# Patient Record
Sex: Female | Born: 1950
Health system: Southern US, Community
[De-identification: ages and names within clinical notes are randomized; demographics above are authoritative.]

## PROBLEM LIST (undated history)

## (undated) DIAGNOSIS — S0300XA Dislocation of jaw, unspecified side, initial encounter: Secondary | ICD-10-CM

## (undated) DIAGNOSIS — Z9889 Other specified postprocedural states: Secondary | ICD-10-CM

## (undated) DIAGNOSIS — R079 Chest pain, unspecified: Secondary | ICD-10-CM

## (undated) DIAGNOSIS — I6523 Occlusion and stenosis of bilateral carotid arteries: Secondary | ICD-10-CM

## (undated) DIAGNOSIS — F329 Major depressive disorder, single episode, unspecified: Secondary | ICD-10-CM

## (undated) DIAGNOSIS — I10 Essential (primary) hypertension: Secondary | ICD-10-CM

## (undated) DIAGNOSIS — H6992 Unspecified Eustachian tube disorder, left ear: Secondary | ICD-10-CM

## (undated) DIAGNOSIS — E559 Vitamin D deficiency, unspecified: Secondary | ICD-10-CM

## (undated) DIAGNOSIS — Z91018 Allergy to other foods: Secondary | ICD-10-CM

## (undated) DIAGNOSIS — K118 Other diseases of salivary glands: Secondary | ICD-10-CM

## (undated) DIAGNOSIS — R519 Headache, unspecified: Secondary | ICD-10-CM

## (undated) DIAGNOSIS — F32A Depression, unspecified: Secondary | ICD-10-CM

## (undated) DIAGNOSIS — E785 Hyperlipidemia, unspecified: Secondary | ICD-10-CM

## (undated) DIAGNOSIS — H9312 Tinnitus, left ear: Secondary | ICD-10-CM

## (undated) DIAGNOSIS — S32000A Wedge compression fracture of unspecified lumbar vertebra, initial encounter for closed fracture: Secondary | ICD-10-CM

## (undated) DIAGNOSIS — R51 Headache: Secondary | ICD-10-CM

## (undated) DIAGNOSIS — R7989 Other specified abnormal findings of blood chemistry: Secondary | ICD-10-CM

## (undated) DIAGNOSIS — S32019A Unspecified fracture of first lumbar vertebra, initial encounter for closed fracture: Secondary | ICD-10-CM

## (undated) DIAGNOSIS — R7301 Impaired fasting glucose: Secondary | ICD-10-CM

## (undated) DIAGNOSIS — F419 Anxiety disorder, unspecified: Secondary | ICD-10-CM

## (undated) DIAGNOSIS — R0600 Dyspnea, unspecified: Secondary | ICD-10-CM

## (undated) HISTORY — DX: Chest pain, unspecified: R07.9

## (undated) HISTORY — DX: Dyspnea, unspecified: R06.00

## (undated) HISTORY — DX: Impaired fasting glucose: R73.01

## (undated) HISTORY — DX: Vitamin D deficiency, unspecified: E55.9

## (undated) HISTORY — DX: Wedge compression fracture of unspecified lumbar vertebra, initial encounter for closed fracture: S32.000A

## (undated) HISTORY — DX: Other diseases of salivary glands: K11.8

## (undated) HISTORY — DX: Other specified abnormal findings of blood chemistry: R79.89

## (undated) HISTORY — PX: TONSILLECTOMY: SUR1361

## (undated) HISTORY — DX: Essential (primary) hypertension: I10

## (undated) HISTORY — DX: Other specified postprocedural states: Z98.890

## (undated) HISTORY — DX: Allergy to other foods: Z91.018

## (undated) HISTORY — DX: Depression, unspecified: F32.A

## (undated) HISTORY — DX: Unspecified fracture of first lumbar vertebra, initial encounter for closed fracture: S32.019A

## (undated) HISTORY — DX: Unspecified Eustachian tube disorder, left ear: H69.92

## (undated) HISTORY — DX: Occlusion and stenosis of bilateral carotid arteries: I65.23

## (undated) HISTORY — DX: Major depressive disorder, single episode, unspecified: F32.9

## (undated) HISTORY — DX: Tinnitus, left ear: H93.12

## (undated) HISTORY — PX: NOSE SURGERY: SHX723

---

## 1999-04-15 ENCOUNTER — Other Ambulatory Visit: Admission: RE | Admit: 1999-04-15 | Discharge: 1999-04-15 | Payer: Self-pay | Admitting: Gynecology

## 2000-12-05 ENCOUNTER — Inpatient Hospital Stay (HOSPITAL_COMMUNITY): Admission: AD | Admit: 2000-12-05 | Discharge: 2000-12-05 | Payer: Self-pay | Admitting: *Deleted

## 2000-12-25 ENCOUNTER — Other Ambulatory Visit: Admission: RE | Admit: 2000-12-25 | Discharge: 2000-12-25 | Payer: Self-pay | Admitting: Gynecology

## 2001-12-31 ENCOUNTER — Other Ambulatory Visit: Admission: RE | Admit: 2001-12-31 | Discharge: 2001-12-31 | Payer: Self-pay | Admitting: Gynecology

## 2003-04-03 ENCOUNTER — Other Ambulatory Visit: Admission: RE | Admit: 2003-04-03 | Discharge: 2003-04-03 | Payer: Self-pay | Admitting: Gynecology

## 2003-06-28 ENCOUNTER — Ambulatory Visit (HOSPITAL_COMMUNITY): Admission: RE | Admit: 2003-06-28 | Discharge: 2003-06-28 | Payer: Self-pay | Admitting: Gastroenterology

## 2004-08-13 ENCOUNTER — Other Ambulatory Visit: Admission: RE | Admit: 2004-08-13 | Discharge: 2004-08-13 | Payer: Self-pay | Admitting: Gynecology

## 2005-08-14 ENCOUNTER — Other Ambulatory Visit: Admission: RE | Admit: 2005-08-14 | Discharge: 2005-08-14 | Payer: Self-pay | Admitting: Gynecology

## 2006-04-10 ENCOUNTER — Encounter: Admission: RE | Admit: 2006-04-10 | Discharge: 2006-04-10 | Payer: Self-pay | Admitting: Gynecology

## 2006-04-15 ENCOUNTER — Encounter: Admission: RE | Admit: 2006-04-15 | Discharge: 2006-06-09 | Payer: Self-pay | Admitting: Anesthesiology

## 2007-10-28 ENCOUNTER — Other Ambulatory Visit: Admission: RE | Admit: 2007-10-28 | Discharge: 2007-10-28 | Payer: Self-pay | Admitting: Gynecology

## 2007-11-19 ENCOUNTER — Encounter: Admission: RE | Admit: 2007-11-19 | Discharge: 2007-11-19 | Payer: Self-pay | Admitting: Gynecology

## 2008-12-29 HISTORY — PX: FOOT SURGERY: SHX648

## 2009-02-05 ENCOUNTER — Ambulatory Visit: Payer: Self-pay | Admitting: Gynecology

## 2009-03-14 ENCOUNTER — Encounter: Payer: Self-pay | Admitting: Gynecology

## 2009-03-14 ENCOUNTER — Ambulatory Visit: Payer: Self-pay | Admitting: Gynecology

## 2009-03-14 ENCOUNTER — Other Ambulatory Visit: Admission: RE | Admit: 2009-03-14 | Discharge: 2009-03-14 | Payer: Self-pay | Admitting: Gynecology

## 2009-03-26 ENCOUNTER — Encounter: Admission: RE | Admit: 2009-03-26 | Discharge: 2009-03-26 | Payer: Self-pay | Admitting: Gynecology

## 2009-08-07 ENCOUNTER — Emergency Department (HOSPITAL_COMMUNITY): Admission: EM | Admit: 2009-08-07 | Discharge: 2009-08-07 | Payer: Self-pay | Admitting: Emergency Medicine

## 2010-04-10 ENCOUNTER — Encounter: Admission: RE | Admit: 2010-04-10 | Discharge: 2010-04-10 | Payer: Self-pay | Admitting: Gynecology

## 2010-04-17 ENCOUNTER — Other Ambulatory Visit: Admission: RE | Admit: 2010-04-17 | Discharge: 2010-04-17 | Payer: Self-pay | Admitting: Gynecology

## 2010-04-17 ENCOUNTER — Ambulatory Visit: Payer: Self-pay | Admitting: Gynecology

## 2010-05-28 ENCOUNTER — Ambulatory Visit: Payer: Self-pay | Admitting: Gynecology

## 2010-05-28 DIAGNOSIS — M810 Age-related osteoporosis without current pathological fracture: Secondary | ICD-10-CM | POA: Insufficient documentation

## 2011-01-02 ENCOUNTER — Ambulatory Visit
Admission: RE | Admit: 2011-01-02 | Discharge: 2011-01-02 | Payer: Self-pay | Source: Home / Self Care | Attending: Gynecology | Admitting: Gynecology

## 2011-04-05 LAB — CBC
HCT: 35.1 % — ABNORMAL LOW (ref 36.0–46.0)
MCHC: 34.5 g/dL (ref 30.0–36.0)
Platelets: 244 10*3/uL (ref 150–400)
RBC: 3.74 MIL/uL — ABNORMAL LOW (ref 3.87–5.11)
RDW: 12.8 % (ref 11.5–15.5)
WBC: 5.3 10*3/uL (ref 4.0–10.5)

## 2011-04-05 LAB — POCT I-STAT, CHEM 8
BUN: 24 mg/dL — ABNORMAL HIGH (ref 6–23)
Calcium, Ion: 1.11 mmol/L — ABNORMAL LOW (ref 1.12–1.32)
Glucose, Bld: 90 mg/dL (ref 70–99)
Hemoglobin: 12.9 g/dL (ref 12.0–15.0)
Potassium: 3.8 mEq/L (ref 3.5–5.1)
Sodium: 133 mEq/L — ABNORMAL LOW (ref 135–145)
TCO2: 23 mmol/L (ref 0–100)

## 2011-04-05 LAB — URINALYSIS, ROUTINE W REFLEX MICROSCOPIC
Bilirubin Urine: NEGATIVE
Hgb urine dipstick: NEGATIVE
Ketones, ur: NEGATIVE mg/dL
Nitrite: NEGATIVE

## 2011-04-05 LAB — DIFFERENTIAL
Eosinophils Absolute: 0 10*3/uL (ref 0.0–0.7)
Lymphocytes Relative: 20 % (ref 12–46)
Lymphs Abs: 1 10*3/uL (ref 0.7–4.0)
Monocytes Relative: 8 % (ref 3–12)

## 2011-04-05 LAB — COMPREHENSIVE METABOLIC PANEL
Alkaline Phosphatase: 82 U/L (ref 39–117)
BUN: 20 mg/dL (ref 6–23)
CO2: 26 mEq/L (ref 19–32)
GFR calc Af Amer: >60 mL/min (ref >60)
GFR calc non Af Amer: 55 mL/min — ABNORMAL LOW (ref >60)
Potassium: 3.8 mEq/L (ref 3.5–5.1)
Sodium: 134 mEq/L — ABNORMAL LOW (ref 135–145)
Total Bilirubin: 0.7 mg/dL (ref 0.3–1.2)
Total Protein: 6.1 g/dL (ref 6.0–8.3)

## 2011-04-05 LAB — HEMOCCULT GUIAC POC 1CARD (OFFICE): Fecal Occult Bld: NEGATIVE

## 2011-04-05 LAB — TYPE AND SCREEN
ABO/RH(D): A POS
Antibody Screen: POSITIVE
DAT, IgG: NEGATIVE

## 2011-04-05 LAB — LACTIC ACID, PLASMA: Lactic Acid, Venous: 0.8 mmol/L (ref 0.5–2.2)

## 2011-04-05 LAB — BRAIN NATRIURETIC PEPTIDE: Pro B Natriuretic peptide (BNP): <30 pg/mL (ref 0.0–100.0)

## 2011-04-05 LAB — D-DIMER, QUANTITATIVE: D-Dimer, Quant: <0.22 ug/mL-FEU (ref 0.00–0.48)

## 2011-04-05 LAB — PROTIME-INR: INR: 1.2 (ref 0.00–1.49)

## 2011-04-05 LAB — GLUCOSE, CAPILLARY

## 2011-04-05 LAB — POCT CARDIAC MARKERS: CKMB, poc: 1.3 ng/mL (ref 1.0–8.0)

## 2011-04-05 LAB — URINE CULTURE

## 2011-04-05 LAB — APTT: aPTT: 24 seconds (ref 24–37)

## 2011-05-16 NOTE — Op Note (Signed)
   NAME:  Gina Fernandez, Gina Fernandez                        ACCOUNT NO.:  [id number]   MEDICAL RECORD NO.:  [id number]                   PATIENT TYPE:  AMB   LOCATION:  ENDO                                 FACILITY:  [facility]   PHYSICIAN:  John C. Madilyn Fireman, M.D.                 DATE OF BIRTH:  1950-12-31   DATE OF PROCEDURE:  06/28/2003  DATE OF DISCHARGE:                                 OPERATIVE REPORT   PROCEDURE:  Flexible sigmoidoscopy.   INDICATION FOR PROCEDURE:  Colon cancer screening in a 60 year old patient  with no prior colon screening.   PROCEDURE:  The patient was placed in the left lateral decubitus position  and placed on the pulse monitor with continuous low-flow oxygen delivered by  nasal cannula.  She was sedated with 125 mcg IV fentanyl and 14 mg IV  Versed.  During the sedation she was continuously physically restless and  very verbal and would not follow instructions and never could be adequately  sedated.  She flinched when the scope was inserted into the rectum and with  any kind of movement of the scope whatsoever.  At about 20-25 cm there was a  tight angulation at the rectosigmoid junction, and she did not tolerate  attempts to traverse it.  At that point I just decided to limit the  procedure with further attempts, and attempted colonoscopy was converted to  a flexible sigmoidoscopy.  The distal sigmoid and rectum appeared normal.  The scope was then withdrawn and the patient returned to the recovery room  in stable condition.  She did not tolerate the procedure well as outlined  above, but there were no immediate complications.   IMPRESSION:  1. Normal sigmoidoscopy to 30 cm.  2. Unable to advance scope further due to inability to sedate the patient.   PLAN:  We will see if she can tolerate barium enema at some point.  If not,  I will take Hemoccults and if positive, may need to do the procedure with  use of Propofol.     John C. Madilyn Fireman, M.D.    J[facility]/MEDQ  D:  06/28/2003  T:  06/28/2003  Job:  [id number]   cc:   Pam Drown, M.D.  [address]  [place]  [place] [address]  Fax: [phone]

## 2011-08-21 ENCOUNTER — Encounter: Payer: Self-pay | Admitting: Gynecology

## 2011-08-21 ENCOUNTER — Other Ambulatory Visit (HOSPITAL_COMMUNITY)
Admission: RE | Admit: 2011-08-21 | Discharge: 2011-08-21 | Disposition: A | Discharge status: Home / Self Care | Payer: BC Managed Care – PPO | Source: Ambulatory Visit | Attending: Gynecology | Admitting: Gynecology

## 2011-08-21 ENCOUNTER — Ambulatory Visit (INDEPENDENT_AMBULATORY_CARE_PROVIDER_SITE_OTHER): Payer: BC Managed Care – PPO | Admitting: Gynecology

## 2011-08-21 VITALS — Ht 62.0 in | Wt 132.0 lb

## 2011-08-21 DIAGNOSIS — Z01419 Encounter for gynecological examination (general) (routine) without abnormal findings: Secondary | ICD-10-CM | POA: Insufficient documentation

## 2011-08-21 DIAGNOSIS — B9689 Other specified bacterial agents as the cause of diseases classified elsewhere: Secondary | ICD-10-CM

## 2011-08-21 DIAGNOSIS — N760 Acute vaginitis: Secondary | ICD-10-CM

## 2011-08-21 DIAGNOSIS — N898 Other specified noninflammatory disorders of vagina: Secondary | ICD-10-CM

## 2011-08-21 DIAGNOSIS — F329 Major depressive disorder, single episode, unspecified: Secondary | ICD-10-CM | POA: Insufficient documentation

## 2011-08-21 DIAGNOSIS — B3731 Acute candidiasis of vulva and vagina: Secondary | ICD-10-CM

## 2011-08-21 DIAGNOSIS — A499 Bacterial infection, unspecified: Secondary | ICD-10-CM

## 2011-08-21 DIAGNOSIS — N951 Menopausal and female climacteric states: Secondary | ICD-10-CM

## 2011-08-21 DIAGNOSIS — B373 Candidiasis of vulva and vagina: Secondary | ICD-10-CM

## 2011-08-21 DIAGNOSIS — I10 Essential (primary) hypertension: Secondary | ICD-10-CM | POA: Insufficient documentation

## 2011-08-21 DIAGNOSIS — F32A Depression, unspecified: Secondary | ICD-10-CM | POA: Insufficient documentation

## 2011-08-21 DIAGNOSIS — R82998 Other abnormal findings in urine: Secondary | ICD-10-CM

## 2011-08-21 DIAGNOSIS — N390 Urinary tract infection, site not specified: Secondary | ICD-10-CM

## 2011-08-21 DIAGNOSIS — R3915 Urgency of urination: Secondary | ICD-10-CM

## 2011-08-21 DIAGNOSIS — M810 Age-related osteoporosis without current pathological fracture: Secondary | ICD-10-CM

## 2011-08-21 MED ORDER — PHENAZOPYRIDINE HCL 200 MG PO TABS: 200.0000 mg | ORAL_TABLET | Freq: Three times a day (TID) | ORAL | Status: DC | PRN

## 2011-08-21 MED ORDER — METRONIDAZOLE 500 MG PO TABS: 500.0000 mg | ORAL_TABLET | Freq: Two times a day (BID) | ORAL | Status: AC

## 2011-08-21 MED ORDER — FLUCONAZOLE 150 MG PO TABS: 150.0000 mg | ORAL_TABLET | Freq: Once | ORAL | Status: AC

## 2011-08-21 MED ORDER — SULFAMETHOXAZOLE-TRIMETHOPRIM 800-160 MG PO TABS: 1.0000 | ORAL_TABLET | Freq: Two times a day (BID) | ORAL | Status: AC

## 2011-08-21 NOTE — Progress Notes (Signed)
Gina Fernandez 03/30/1951 [id number]        60 y.o.  for annual exam.  For the last 2 days she has had worsening dysuria and frequency. She had a Septra and a pyridium from a prior treatment at home she took these last night. No fevers chills or other constitutional symptoms.   She does note some sweating although she is exercising more but said this seems to be in excess of what she normally does. No real night sweats but more hot flushes during the day. She is postmenopausal had been on HRT a number of years ago but none recently.  No hair changes skin changes weight changes or other constitutional symptoms. She also has a history of osteoporosis -3.5 [facility] may 2011 DEXA study. She had been on bisphosphonates starting in 2005 discontinued in 9 months ago herself. She does note some bilateral leg tenderness both in the upper mid femur range as well as in the lower mid leg range. She notes this primarily when she is up and down stairs again she has been exercising more and beginning to jog.  Past medical history,surgical history, allergies, family history and social history were all reviewed and documented in the EPIC chart. ROS:  Was performed and pertinent positives and negatives are included in the history.  Exam: chaperone present There were no vitals filed for this visit. General appearance  Normal Skin grossly normal Head/Neck normal with no cervical or supraclavicular adenopathy thyroid normal Lungs  clear Cardiac RR, without RMG Abdominal  soft, nontender, without masses, organomegaly or hernia Breasts  examined lying and sitting without masses, retractions, discharge or axillary adenopathy. Pelvic  Ext/BUS/vagina  normal white discharge noted KOH wet prep done,atrophic changes noted  Cervix  normal  Pap done  Uterus  Antevert it, normal size, shape and contour, midline and mobile nontender   Adnexa  Without masses or tenderness    Anus and perineum  normal   Rectovaginal  normal  sphincter tone without palpated masses or tenderness.    Assessment/Plan:  60 y.o. female for annual exam.    #1 UTI. Urine analysis is consistent with UTI as is her symptoms. We'll treat with Septra DS 1 by mouth twice a day x3 days and peridium 200 mg 3 times a day x5 days as needed. Followup if symptoms persist or recur #2 White discharge. KOH wet prep is positive for yeast and BV we'll treat with Diflucan 150x1 dose Flagyl 500 mg twice a day x7 days supple avoidance discussed. #3 Sweating. When check baseline TSH. If normal then we'll follow for now. #4 Osteoporosis. Last DEXA May 2011 [place] -3.5 T score. I've recommended she reschedule her DEXA scan now for a short interval followup. She has been on bisphosphonates for 6-7 years she discontinued them herself 9 months ago. I again reviewed the issues of bisphosphonates in risks to include long-term use possible association with atypical fractures. She is complaining of leg pain on both bilateral both the upper and lower extremities she is starting to jog am wondering if it's not more exercise related. I am not sure what the best test for atypical fracture screening and I told her I would get back to her on that after discussion with my colleagues. If bone density is stable on short interval followup options for stopping treatment at this time and then restudied in one to 2 years versus switching to a different medication such as Prolia, Forteo possible Evista reviewed.  We will further  discuss after her DEXA study. I did order a vitamin D level as she is known to be low and vitamin D her last value in March of this year was in the 30 range. #5 Health maintenance. Self breast exams on a monthly basis discussed urged. She is due for mammogram now knows to schedule this. Patient reports having attempted colonoscopy but the gastroenterologist said they did not complete it and I encouraged her to followup with the gastroenterologist to have a colonoscopy. No  other blood work was done today besides her TSH and vitamin D level was all done through her primary who sees her routinely for her medical issues.    Dara Lords MD, 4:57 PM 08/21/2011

## 2011-09-17 ENCOUNTER — Telehealth: Payer: Self-pay | Admitting: *Deleted

## 2011-09-17 NOTE — Telephone Encounter (Signed)
Message copied by Valeda Malm L on Wed Sep 17, 2011  4:50 PM ------      Message from: Colin Broach P      Created: Wed Sep 17, 2011  9:57 AM       Does patient have appointment to see me?  If not she needs one reference to her leg pain.

## 2011-09-19 NOTE — Telephone Encounter (Signed)
Lm for patient to call

## 2011-09-23 NOTE — Telephone Encounter (Signed)
Lm for patient to call the office to make an appointment for leg pain.

## 2011-10-07 NOTE — Telephone Encounter (Signed)
Lm on mobile vm.  Informed information below.

## 2011-10-17 ENCOUNTER — Encounter: Payer: Self-pay | Admitting: *Deleted

## 2012-04-06 ENCOUNTER — Other Ambulatory Visit: Payer: Self-pay | Admitting: *Deleted

## 2012-04-06 DIAGNOSIS — E559 Vitamin D deficiency, unspecified: Secondary | ICD-10-CM

## 2012-04-30 ENCOUNTER — Emergency Department (HOSPITAL_COMMUNITY)
Admission: EM | Admit: 2012-04-30 | Discharge: 2012-04-30 | Disposition: A | Discharge status: Home / Self Care | Payer: BC Managed Care – PPO | Attending: Emergency Medicine | Admitting: Emergency Medicine

## 2012-04-30 ENCOUNTER — Other Ambulatory Visit: Payer: Self-pay

## 2012-04-30 ENCOUNTER — Emergency Department (HOSPITAL_COMMUNITY): Payer: BC Managed Care – PPO

## 2012-04-30 ENCOUNTER — Encounter (HOSPITAL_COMMUNITY): Payer: Self-pay | Admitting: Emergency Medicine

## 2012-04-30 DIAGNOSIS — R61 Generalized hyperhidrosis: Secondary | ICD-10-CM | POA: Insufficient documentation

## 2012-04-30 DIAGNOSIS — R079 Chest pain, unspecified: Secondary | ICD-10-CM | POA: Insufficient documentation

## 2012-04-30 DIAGNOSIS — M810 Age-related osteoporosis without current pathological fracture: Secondary | ICD-10-CM | POA: Insufficient documentation

## 2012-04-30 DIAGNOSIS — R0602 Shortness of breath: Secondary | ICD-10-CM | POA: Insufficient documentation

## 2012-04-30 DIAGNOSIS — I10 Essential (primary) hypertension: Secondary | ICD-10-CM | POA: Insufficient documentation

## 2012-04-30 DIAGNOSIS — F341 Dysthymic disorder: Secondary | ICD-10-CM | POA: Insufficient documentation

## 2012-04-30 DIAGNOSIS — Z79899 Other long term (current) drug therapy: Secondary | ICD-10-CM | POA: Insufficient documentation

## 2012-04-30 HISTORY — DX: Anxiety disorder, unspecified: F41.9

## 2012-04-30 LAB — BASIC METABOLIC PANEL
CO2: 27 mEq/L (ref 19–32)
Chloride: 104 mEq/L (ref 96–112)
Glucose, Bld: 91 mg/dL (ref 70–99)
Potassium: 4.2 mEq/L (ref 3.5–5.1)
Sodium: 140 mEq/L (ref 135–145)

## 2012-04-30 LAB — HEPATIC FUNCTION PANEL
ALT: 26 U/L (ref 0–35)
AST: 22 U/L (ref 0–37)
Alkaline Phosphatase: 65 U/L (ref 39–117)
Bilirubin, Direct: <0.1 mg/dL (ref 0.0–0.3)

## 2012-04-30 LAB — POCT I-STAT TROPONIN I: Troponin i, poc: 0 ng/mL (ref 0.00–0.08)

## 2012-04-30 LAB — CBC
Hemoglobin: 12.7 g/dL (ref 12.0–15.0)
MCH: 31.8 pg (ref 26.0–34.0)
MCV: 93.8 fL (ref 78.0–100.0)
RBC: 4 MIL/uL (ref 3.87–5.11)

## 2012-04-30 MED ORDER — IOHEXOL 350 MG/ML SOLN
100.0000 mL | Freq: Once | INTRAVENOUS | Status: AC | PRN
  Administered 2012-04-30: 100 mL via INTRAVENOUS

## 2012-04-30 MED ORDER — FAMOTIDINE 20 MG PO TABS
40.0000 mg | ORAL_TABLET | Freq: Once | ORAL | Status: AC
  Administered 2012-04-30: 40 mg via ORAL
  Filled 2012-04-30: qty 2

## 2012-04-30 MED ORDER — GI COCKTAIL ~~LOC~~
30.0000 mL | Freq: Once | ORAL | Status: AC
  Administered 2012-04-30: 30 mL via ORAL
  Filled 2012-04-30: qty 30

## 2012-04-30 NOTE — ED Notes (Signed)
Report received from [facility].  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  On EMS arrival to [facility] pt was hyperventilating and tearful.  History of anxiety and depression but pt did not feel it was related.  Has not recently had anxiety meds but has them for prn.  Pt received Asa 324mg  at Clinic and NTG x 3 by EMS.  Pt's pain decreased to 2/10 and is mainly in anterior neck now. On EMS arrival to clinic BP 170/Palpated and 118/86 on arrival to ED.

## 2012-04-30 NOTE — ED Provider Notes (Addendum)
History     CSN: [id number]  Arrival date & time 04/30/12  Barry Brunner   First MD Initiated Contact with Patient 04/30/12 1958      Chief Complaint  Patient presents with  . Chest Pain    (Consider location/radiation/quality/duration/timing/severity/associated sxs/prior treatment) Patient is a 61 y.o. female presenting with chest pain. The history is provided by the patient.  Chest Pain    patient here with chest pain and pressure has been constant for 3 days has become worse today and associated with sharp pain that starts in the upper chest no suture or back. Some associated shortness of breath and diaphoresis. Called EMS was given nitroglycerin and pain resolved. No prior history of CAD. No recent fever or cold-like symptoms. Symptoms began at rest. Micah Flesher to her, care doctor's office for this given aspirin sent here  Past Medical History  Diagnosis Date  . Hypertension   . Osteoporosis 05/28/2010    SEE [facility] DEXA REPORT  . Depression   . Anxiety     Past Surgical History  Procedure Date  . Foot surgery 2010    LEFT FOOT    Family History  Problem Relation Age of Onset  . Hypertension Mother   . Hypertension Father   . Heart disease Father   . Breast cancer Maternal Aunt     History  Substance Use Topics  . Smoking status: Never Smoker   . Smokeless tobacco: Never Used  . Alcohol Use: Yes     some    OB History    Grav Para Term Preterm Abortions TAB SAB Ect Mult Living   1 1        1       Review of Systems  Cardiovascular: Positive for chest pain.  All other systems reviewed and are negative.    Allergies  Penicillins  Home Medications   Current Outpatient Rx  Name Route Sig Dispense Refill  . ALPRAZOLAM 0.5 MG PO TABS Oral Take 0.5 mg by mouth as needed. For anxiety    . MICARDIS PO Oral Take 1 tablet by mouth daily.     Marland Kitchen TRIAZOLAM 0.25 MG PO TABS Oral Take 0.25-0.75 mg by mouth at bedtime as needed.    . VENLAFAXINE HCL ER 150 MG PO CP24 Oral Take  300 mg by mouth daily.      BP 131/67  Temp(Src) 97.8 F (36.6 C) (Oral)  Resp 17  SpO2 100%  LMP 12/29/2004  Physical Exam  Nursing note and vitals reviewed. Constitutional: She is oriented to person, place, and time. She appears well-developed and well-nourished.  Non-toxic appearance. No distress.  HENT:  Head: Normocephalic and atraumatic.  Eyes: Conjunctivae, EOM and lids are normal. Pupils are equal, round, and reactive to light.  Neck: Normal range of motion. Neck supple. No tracheal deviation present. No mass present.  Cardiovascular: Normal rate, regular rhythm and normal heart sounds.  Exam reveals no gallop.   No murmur heard. Pulmonary/Chest: Effort normal and breath sounds normal. No stridor. No respiratory distress. She has no decreased breath sounds. She has no wheezes. She has no rhonchi. She has no rales.  Abdominal: Soft. Normal appearance and bowel sounds are normal. She exhibits no distension. There is no tenderness. There is no rebound and no CVA tenderness.  Musculoskeletal: Normal range of motion. She exhibits no edema and no tenderness.  Neurological: She is alert and oriented to person, place, and time. She has normal strength. No cranial nerve deficit or sensory deficit.  GCS eye subscore is 4. GCS verbal subscore is 5. GCS motor subscore is 6.  Skin: Skin is warm and dry. No abrasion and no rash noted.  Psychiatric: She has a normal mood and affect. Her speech is normal and behavior is normal.    ED Course  Procedures (including critical care time)   Labs Reviewed  BASIC METABOLIC PANEL  CBC  HEPATIC FUNCTION PANEL  PROTIME-INR  APTT   No results found.   No diagnosis found.    MDM   Date: 04/30/2012  Rate: 56  Rhythm: normal sinus rhythm  QRS Axis: left  Intervals: normal  ST/T Wave abnormalities: normal  Conduction Disutrbances:none  Narrative Interpretation:   Old EKG Reviewed: unchanged  11:01 PM Pt offered admission and deferred  -- risk of sudden death explained--pt will f/u her pcp         Toy Baker, MD 04/30/12 2302  Toy Baker, MD 04/30/12 2302

## 2012-04-30 NOTE — Discharge Instructions (Signed)
Chest Pain (Nonspecific) It is often hard to give a specific diagnosis for the cause of chest pain. There is always a chance that your pain could be related to something serious, such as a heart attack or a blood clot in the lungs. You need to follow up with your caregiver for further evaluation. CAUSES   Heartburn.   Pneumonia or bronchitis.   Anxiety or stress.   Inflammation around your heart (pericarditis) or lung (pleuritis or pleurisy).   A blood clot in the lung.   A collapsed lung (pneumothorax). It can develop suddenly on its own (spontaneous pneumothorax) or from injury (trauma) to the chest.   Shingles infection (herpes zoster virus).  The chest wall is composed of bones, muscles, and cartilage. Any of these can be the source of the pain.  The bones can be bruised by injury.   The muscles or cartilage can be strained by coughing or overwork.   The cartilage can be affected by inflammation and become sore (costochondritis).  DIAGNOSIS  Lab tests or other studies, such as X-rays, electrocardiography, stress testing, or cardiac imaging, may be needed to find the cause of your pain.  TREATMENT   Treatment depends on what may be causing your chest pain. Treatment may include:   Acid blockers for heartburn.   Anti-inflammatory medicine.   Pain medicine for inflammatory conditions.   Antibiotics if an infection is present.   You may be advised to change lifestyle habits. This includes stopping smoking and avoiding alcohol, caffeine, and chocolate.   You may be advised to keep your head raised (elevated) when sleeping. This reduces the chance of acid going backward from your stomach into your esophagus.   Most of the time, nonspecific chest pain will improve within 2 to 3 days with rest and mild pain medicine.  HOME CARE INSTRUCTIONS   If antibiotics were prescribed, take your antibiotics as directed. Finish them even if you start to feel better.   For the next few  days, avoid physical activities that bring on chest pain. Continue physical activities as directed.   Do not smoke.   Avoid drinking alcohol.   Only take over-the-counter or prescription medicine for pain, discomfort, or fever as directed by your caregiver.   Follow your caregiver's suggestions for further testing if your chest pain does not go away.   Keep any follow-up appointments you made. If you do not go to an appointment, you could develop lasting (chronic) problems with pain. If there is any problem keeping an appointment, you must call to reschedule.  SEEK MEDICAL CARE IF:   You think you are having problems from the medicine you are taking. Read your medicine instructions carefully.   Your chest pain does not go away, even after treatment.   You develop a rash with blisters on your chest.  SEEK IMMEDIATE MEDICAL CARE IF:   You have increased chest pain or pain that spreads to your arm, neck, jaw, back, or abdomen.   You develop shortness of breath, an increasing cough, or you are coughing up blood.   You have severe back or abdominal pain, feel nauseous, or vomit.   You develop severe weakness, fainting, or chills.   You have a fever.  THIS IS AN EMERGENCY. Do not wait to see if the pain will go away. Get medical help at once. Call your local emergency services (911 in [place] Do not drive yourself to the hospital. MAKE SURE YOU:   Understand these instructions.     Will watch your condition.   Will get help right away if you are not doing well or get worse.  Document Released: 09/24/2005 Document Revised: 12/04/2011 Document Reviewed: 07/20/2008 Exit[employer] Patient Information 2012 [employer]. 

## 2012-11-30 ENCOUNTER — Encounter: Payer: Self-pay | Admitting: Gynecology

## 2012-11-30 ENCOUNTER — Ambulatory Visit (INDEPENDENT_AMBULATORY_CARE_PROVIDER_SITE_OTHER): Payer: BC Managed Care – PPO | Admitting: Gynecology

## 2012-11-30 VITALS — BP 124/90 | Ht 61.5 in | Wt 132.0 lb

## 2012-11-30 DIAGNOSIS — M810 Age-related osteoporosis without current pathological fracture: Secondary | ICD-10-CM

## 2012-11-30 DIAGNOSIS — Z01419 Encounter for gynecological examination (general) (routine) without abnormal findings: Secondary | ICD-10-CM

## 2012-11-30 NOTE — Patient Instructions (Signed)
Make sure you monitor your blood pressure. If remains elevated follow up with her primary physician. Follow up for your bone density study. Follow up in one year for annual exam.

## 2012-11-30 NOTE — Progress Notes (Signed)
Gina Fernandez [birth year] [id number]        61 y.o.  G1P1 for annual exam.  Several issues below  Past medical history,surgical history, medications, allergies, family history and social history were all reviewed and documented in the EPIC chart. ROS:  Was performed and pertinent positives and negatives are included in the history.  Exam: Fleet Contras assistant Filed Vitals:   11/30/12 1204 11/30/12 1235  BP: 142/96 124/90  Height: 5' 1.5" (1.562 m)   Weight: 132 lb (59.875 kg)    General appearance  Normal Skin grossly normal Head/Neck normal with no cervical or supraclavicular adenopathy thyroid normal Lungs  clear Cardiac RR, without RMG Abdominal  soft, nontender, without masses, organomegaly or hernia Breasts  examined lying and sitting without masses, retractions, discharge or axillary adenopathy. Pelvic  Ext/BUS/vagina  normal with atrophic changes  Cervix  normal with atrophic changes  Uterus  axial, normal size, shape and contour, midline and mobile nontender   Adnexa  Without masses or tenderness    Anus and perineum  normal   Rectovaginal  normal sphincter tone without palpated masses or tenderness.    Assessment/Plan:  61 y.o. G1P1 female for annual exam.   1. Postmenopausal/atrophic genital changes. Asymptomatic, no bleeding. We'll continue to monitor. 2. Pap smear 07/2011. No Pap smear done today. No history of significant abnormal Pap smears previously. Plan less frequent screening every 3-5 years. 3. Mammography 03/2010. Patient acknowledges she is way overdue and plans to schedule now. S/P monthly reviewed. 4. Osteoporosis. DEXA 04/2010 with T score -3.5 at [facility].  Had been on bisphosphonates for proximally 6-7 years before discontinuing in 2011/2012. Recommend repeat DEXA now and baseline vitamin D as she has been known to have low vitamin D levels before. 5. Colonoscopy. 6 years ago had an incomplete colonoscopy. Recommended she follow up with them to see when  they want to repeat this. 6. Hypertension. Blood pressure mildly elevated. I repeated it and it's 124/90. She knows to follow this if it does remain elevated she is to follow up with her primary physician Dr. Uvaldo Rising. 7. Health maintenance. No other blood work and vitamin D done this is all done through Dr. Mickie Kay office. Follow up for DEXA/vitamin D results otherwise in one year.    Dara Lords MD, 12:48 PM 11/30/2012

## 2012-12-01 LAB — URINALYSIS W MICROSCOPIC + REFLEX CULTURE
Bilirubin Urine: NEGATIVE
Crystals: NONE SEEN
Glucose, UA: NEGATIVE mg/dL
Specific Gravity, Urine: 1.006 (ref 1.005–1.030)
Squamous Epithelial / LPF: NONE SEEN
Urobilinogen, UA: 1 mg/dL (ref 0.0–1.0)

## 2012-12-03 ENCOUNTER — Other Ambulatory Visit: Payer: BC Managed Care – PPO

## 2012-12-03 ENCOUNTER — Other Ambulatory Visit: Payer: Self-pay | Admitting: Gynecology

## 2012-12-03 DIAGNOSIS — R7881 Bacteremia: Secondary | ICD-10-CM

## 2012-12-03 LAB — URINE CULTURE: Colony Count: 55000

## 2012-12-05 LAB — URINE CULTURE: Organism ID, Bacteria: NO GROWTH

## 2013-01-11 ENCOUNTER — Ambulatory Visit (INDEPENDENT_AMBULATORY_CARE_PROVIDER_SITE_OTHER): Payer: BC Managed Care – PPO

## 2013-01-11 DIAGNOSIS — M810 Age-related osteoporosis without current pathological fracture: Secondary | ICD-10-CM

## 2013-01-12 ENCOUNTER — Encounter: Payer: Self-pay | Admitting: Gynecology

## 2013-04-05 ENCOUNTER — Other Ambulatory Visit: Payer: Self-pay | Admitting: Family Medicine

## 2013-04-05 ENCOUNTER — Ambulatory Visit
Admission: RE | Admit: 2013-04-05 | Discharge: 2013-04-05 | Disposition: A | Discharge status: Home / Self Care | Payer: BC Managed Care – PPO | Source: Ambulatory Visit | Attending: Family Medicine | Admitting: Family Medicine

## 2013-04-05 DIAGNOSIS — W19XXXA Unspecified fall, initial encounter: Secondary | ICD-10-CM

## 2013-04-05 MED ORDER — IOHEXOL 300 MG/ML  SOLN
100.0000 mL | Freq: Once | INTRAMUSCULAR | Status: AC | PRN
  Administered 2013-04-05: 100 mL via INTRAVENOUS

## 2013-06-14 ENCOUNTER — Ambulatory Visit: Payer: BC Managed Care – PPO | Attending: Family Medicine | Admitting: Physical Therapy

## 2013-06-14 DIAGNOSIS — M542 Cervicalgia: Secondary | ICD-10-CM | POA: Insufficient documentation

## 2013-06-14 DIAGNOSIS — IMO0001 Reserved for inherently not codable concepts without codable children: Secondary | ICD-10-CM | POA: Insufficient documentation

## 2013-06-15 ENCOUNTER — Ambulatory Visit: Payer: BC Managed Care – PPO | Admitting: Physical Therapy

## 2013-06-21 ENCOUNTER — Ambulatory Visit: Payer: BC Managed Care – PPO | Admitting: Physical Therapy

## 2013-06-22 ENCOUNTER — Ambulatory Visit: Payer: BC Managed Care – PPO | Admitting: Physical Therapy

## 2013-06-22 ENCOUNTER — Encounter: Payer: BC Managed Care – PPO | Admitting: Physical Therapy

## 2013-06-23 ENCOUNTER — Ambulatory Visit: Payer: BC Managed Care – PPO | Admitting: Physical Therapy

## 2013-06-27 ENCOUNTER — Ambulatory Visit: Payer: BC Managed Care – PPO | Admitting: Physical Therapy

## 2013-06-30 ENCOUNTER — Ambulatory Visit: Payer: BC Managed Care – PPO | Admitting: Physical Therapy

## 2013-07-04 ENCOUNTER — Ambulatory Visit: Payer: BC Managed Care – PPO | Attending: Family Medicine | Admitting: Physical Therapy

## 2013-07-04 DIAGNOSIS — IMO0001 Reserved for inherently not codable concepts without codable children: Secondary | ICD-10-CM | POA: Insufficient documentation

## 2013-07-04 DIAGNOSIS — M542 Cervicalgia: Secondary | ICD-10-CM | POA: Insufficient documentation

## 2013-07-06 ENCOUNTER — Ambulatory Visit: Payer: BC Managed Care – PPO | Admitting: Physical Therapy

## 2013-07-11 ENCOUNTER — Ambulatory Visit: Payer: BC Managed Care – PPO | Admitting: Physical Therapy

## 2013-07-13 ENCOUNTER — Ambulatory Visit: Payer: BC Managed Care – PPO | Admitting: Physical Therapy

## 2013-07-18 ENCOUNTER — Ambulatory Visit: Payer: BC Managed Care – PPO | Admitting: Physical Therapy

## 2013-07-19 ENCOUNTER — Ambulatory Visit: Payer: BC Managed Care – PPO | Admitting: Physical Therapy

## 2014-09-13 ENCOUNTER — Ambulatory Visit (INDEPENDENT_AMBULATORY_CARE_PROVIDER_SITE_OTHER): Payer: Medicare Other | Admitting: Gynecology

## 2014-09-13 ENCOUNTER — Other Ambulatory Visit (HOSPITAL_COMMUNITY)
Admission: RE | Admit: 2014-09-13 | Discharge: 2014-09-13 | Disposition: A | Discharge status: Home / Self Care | Payer: Medicare Other | Source: Ambulatory Visit | Attending: Gynecology | Admitting: Gynecology

## 2014-09-13 ENCOUNTER — Encounter: Payer: Self-pay | Admitting: Gynecology

## 2014-09-13 VITALS — BP 130/80 | Ht 63.0 in | Wt 132.0 lb

## 2014-09-13 DIAGNOSIS — Z01419 Encounter for gynecological examination (general) (routine) without abnormal findings: Secondary | ICD-10-CM | POA: Diagnosis present

## 2014-09-13 DIAGNOSIS — N952 Postmenopausal atrophic vaginitis: Secondary | ICD-10-CM

## 2014-09-13 DIAGNOSIS — Z124 Encounter for screening for malignant neoplasm of cervix: Secondary | ICD-10-CM

## 2014-09-13 DIAGNOSIS — M810 Age-related osteoporosis without current pathological fracture: Secondary | ICD-10-CM

## 2014-09-13 NOTE — Progress Notes (Signed)
Gina Fernandez April 06, 1951 [id number]        63 y.o.  G1P1 for follow up exam. Several issues noted below  Past medical history,surgical history, problem list, medications, allergies, family history and social history were all reviewed and documented as reviewed in the EPIC chart.  ROS:  12 system ROS performed with pertinent positives and negatives included in the history, assessment and plan.   Additional significant findings :  none   Exam: Kim Counsellor Vitals:   09/13/14 0902  BP: 130/80  Height: 5\' 3"  (1.6 m)  Weight: 132 lb (59.875 kg)   General appearance:  Normal affect, orientation and appearance. Skin: Grossly normal HEENT: Without gross lesions.  No cervical or supraclavicular adenopathy. Thyroid normal.  Lungs:  Clear without wheezing, rales or rhonchi Cardiac: RR, without RMG Abdominal:  Soft, nontender, without masses, guarding, rebound, organomegaly or hernia Breasts:  Examined lying and sitting without masses, retractions, discharge or axillary adenopathy. Pelvic:  Ext/BUS/vagina with generalized atrophic changes  Cervix atrophic. Pap done  Uterus anteverted, normal size, shape and contour, midline and mobile nontender   Adnexa  Without masses or tenderness    Anus and perineum  Normal   Rectovaginal  Normal sphincter tone without palpated masses or tenderness.    Assessment/Plan:  63 y.o. G1P1 female for follow up exam.   1. Postmenopausal/atrophic genital changes.  Without significant symptoms of hot flushes, night sweats, vaginal dryness or dyspareunia. No vaginal bleeding. Continue to monitor. Report any vaginal bleeding. 2. Pap smear 2012. Pap done today. No history of significant abnormal Pap smears. Options to stop screening if this Pap smear is normal per current screening guidelines as she is 63 discussed. Will readdress on an annual basis. 3. Mammography 2011. I again reviewed that she is way overdue on her mammography. Benefits of early detection  and cure reviewed. Patient agrees to schedule. Names and numbers provided. SBE monthly reviewed. 4. Osteoporosis.  DEXA 12/2012 T score -3.5. Stable from prior DEXA. History of bisphosphate use 6-7 years discontinuing in 2011/2012. Continue to monitor with repeat DEXA next year.  Increased calcium vitamin D reviewed. 5. Colonoscopy attempted approximately 7 years ago but could not complete. I strongly recommended that she make an appointment to see a gastroenterologist and discuss about reattempting or alternatives. Names for [facility] gastroenterology provided. 6. Health maintenance. No routine blood work done as she has this done through her primary physician's office. Follow up one year, sooner as needed.     Anastasio Auerbach MD, 9:25 AM 09/13/2014

## 2014-09-13 NOTE — Patient Instructions (Signed)
Schedule colonoscopy with [facility] at [phone]   Call to Schedule your mammogram  Facilities in [place]: 1)  [facility], [place] Phone: [phone] 2)  [facility], [phone] Phone: [phone] 3)  Dr. Yolanda Bonine at [facility] [address] Phone: [phone]     Mammogram A mammogram is an X-ray test to find changes in a woman's breast. You should get a mammogram if:  You are 22 years of age or older  You have risk factors.   Your doctor recommends that you have one.  BEFORE THE TEST  Do not schedule the test the week before your period, especially if your breasts are sore during this time.  On the day of your mammogram:  Wash your breasts and armpits well. After washing, do not put on any deodorant or talcum powder on until after your test.   Eat and drink as you usually do.   Take your medicines as usual.   If you are diabetic and take insulin, make sure you:   Eat before coming for your test.   Take your insulin as usual.   If you cannot keep your appointment, call before the appointment to cancel. Schedule another appointment.  TEST  You will need to undress from the waist up. You will put on a hospital gown.   Your breast will be put on the mammogram machine, and it will press firmly on your breast with a piece of plastic called a compression paddle. This will make your breast flatter so that the machine can X-ray all parts of your breast.   Both breasts will be X-rayed. Each breast will be X-rayed from above and from the side. An X-ray might need to be taken again if the picture is not good enough.   The mammogram will last about 15 to 30 minutes.  AFTER THE TEST Finding out the results of your test Ask when your test results will be ready. Make sure you get your test results.  Document Released: 03/13/2009 Document  Revised: 12/04/2011 Document Reviewed: 03/13/2009 [facility] Patient Information 2012 [place], [place].  You may obtain a copy of any labs that were done today by logging onto MyChart as outlined in the instructions provided with your AVS (after visit summary). The office will not call with normal lab results but certainly if there are any significant abnormalities then we will contact you.   Health Maintenance, Female A healthy lifestyle and preventative care can promote health and wellness.  Maintain regular health, dental, and eye exams.  Eat a healthy diet. Foods like vegetables, fruits, whole grains, low-fat dairy products, and lean protein foods contain the nutrients you need without too many calories. Decrease your intake of foods high in solid fats, added sugars, and salt. Get information about a proper diet from your caregiver, if necessary.  Regular physical exercise is one of the most important things you can do for your health. Most adults should get at least 150 minutes of moderate-intensity exercise (any activity that increases your heart rate and causes you to sweat) each week. In addition, most adults need muscle-strengthening exercises on 2 or more days a week.   Maintain a healthy weight. The body mass index (BMI) is a screening tool to identify possible weight problems. It provides an estimate of body fat based on height and weight. Your caregiver can help determine your BMI, and can help  you achieve or maintain a healthy weight. For adults 20 years and older:  A BMI below 18.5 is considered underweight.  A BMI of 18.5 to 24.9 is normal.  A BMI of 25 to 29.9 is considered overweight.  A BMI of 30 and above is considered obese.  Maintain normal blood lipids and cholesterol by exercising and minimizing your intake of saturated fat. Eat a balanced diet with plenty of fruits and vegetables. Blood tests for lipids and cholesterol should begin at age 90 and be repeated every 5  years. If your lipid or cholesterol levels are high, you are over 50, or you are a high risk for heart disease, you may need your cholesterol levels checked more frequently.Ongoing high lipid and cholesterol levels should be treated with medicines if diet and exercise are not effective.  If you smoke, find out from your caregiver how to quit. If you do not use tobacco, do not start.  Lung cancer screening is recommended for adults aged 67 80 years who are at high risk for developing lung cancer because of a history of smoking. Yearly low-dose computed tomography (CT) is recommended for people who have at least a 30-pack-year history of smoking and are a current smoker or have quit within the past 15 years. A pack year of smoking is smoking an average of 1 pack of cigarettes a day for 1 year (for example: 1 pack a day for 30 years or 2 packs a day for 15 years). Yearly screening should continue until the smoker has stopped smoking for at least 15 years. Yearly screening should also be stopped for people who develop a health problem that would prevent them from having lung cancer treatment.  If you are pregnant, do not drink alcohol. If you are breastfeeding, be very cautious about drinking alcohol. If you are not pregnant and choose to drink alcohol, do not exceed 1 drink per day. One drink is considered to be 12 ounces (355 mL) of beer, 5 ounces (148 mL) of wine, or 1.5 ounces (44 mL) of liquor.  Avoid use of street drugs. Do not share needles with anyone. Ask for help if you need support or instructions about stopping the use of drugs.  High blood pressure causes heart disease and increases the risk of stroke. Blood pressure should be checked at least every 1 to 2 years. Ongoing high blood pressure should be treated with medicines, if weight loss and exercise are not effective.  If you are 68 to 65 years old, ask your caregiver if you should take aspirin to prevent strokes.  Diabetes screening  involves taking a blood sample to check your fasting blood sugar level. This should be done once every 3 years, after age 46, if you are within normal weight and without risk factors for diabetes. Testing should be considered at a younger age or be carried out more frequently if you are overweight and have at least 1 risk factor for diabetes.  Breast cancer screening is essential preventative care for women. You should practice "breast self-awareness." This means understanding the normal appearance and feel of your breasts and may include breast self-examination. Any changes detected, no matter how small, should be reported to a caregiver. Women in their 21s and 30s should have a clinical breast exam (CBE) by a caregiver as part of a regular health exam every 1 to 3 years. After age 45, women should have a CBE every year. Starting at age 66, women should consider having a  mammogram (breast X-ray) every year. Women who have a family history of breast cancer should talk to their caregiver about genetic screening. Women at a high risk of breast cancer should talk to their caregiver about having an MRI and a mammogram every year.  Breast cancer gene (BRCA)-related cancer risk assessment is recommended for women who have family members with BRCA-related cancers. BRCA-related cancers include breast, ovarian, tubal, and peritoneal cancers. Having family members with these cancers may be associated with an increased risk for harmful changes (mutations) in the breast cancer genes BRCA1 and BRCA2. Results of the assessment will determine the need for genetic counseling and BRCA1 and BRCA2 testing.  The Pap test is a screening test for cervical cancer. Women should have a Pap test starting at age 51. Between ages 53 and 36, Pap tests should be repeated every 2 years. Beginning at age 97, you should have a Pap test every 3 years as long as the past 3 Pap tests have been normal. If you had a hysterectomy for a problem that  was not cancer or a condition that could lead to cancer, then you no longer need Pap tests. If you are between ages 26 and 68, and you have had normal Pap tests going back 10 years, you no longer need Pap tests. If you have had past treatment for cervical cancer or a condition that could lead to cancer, you need Pap tests and screening for cancer for at least 20 years after your treatment. If Pap tests have been discontinued, risk factors (such as a new sexual partner) need to be reassessed to determine if screening should be resumed. Some women have medical problems that increase the chance of getting cervical cancer. In these cases, your caregiver may recommend more frequent screening and Pap tests.  The human papillomavirus (HPV) test is an additional test that may be used for cervical cancer screening. The HPV test looks for the virus that can cause the cell changes on the cervix. The cells collected during the Pap test can be tested for HPV. The HPV test could be used to screen women aged 51 years and older, and should be used in women of any age who have unclear Pap test results. After the age of 70, women should have HPV testing at the same frequency as a Pap test.  Colorectal cancer can be detected and often prevented. Most routine colorectal cancer screening begins at the age of 75 and continues through age 45. However, your caregiver may recommend screening at an earlier age if you have risk factors for colon cancer. On a yearly basis, your caregiver may provide home test kits to check for hidden blood in the stool. Use of a small camera at the end of a tube, to directly examine the colon (sigmoidoscopy or colonoscopy), can detect the earliest forms of colorectal cancer. Talk to your caregiver about this at age 13, when routine screening begins. Direct examination of the colon should be repeated every 5 to 10 years through age 71, unless early forms of pre-cancerous polyps or small growths are  found.  Hepatitis C blood testing is recommended for all people born from [birth year] through 1967 and any individual with known risks for hepatitis C.  Practice safe sex. Use condoms and avoid high-risk sexual practices to reduce the spread of sexually transmitted infections (STIs). Sexually active women aged 25 and younger should be checked for Chlamydia, which is a common sexually transmitted infection. Older women with new or multiple  partners should also be tested for Chlamydia. Testing for other STIs is recommended if you are sexually active and at increased risk.  Osteoporosis is a disease in which the bones lose minerals and strength with aging. This can result in serious bone fractures. The risk of osteoporosis can be identified using a bone density scan. Women ages 65 and over and women at risk for fractures or osteoporosis should discuss screening with their caregivers. Ask your caregiver whether you should be taking a calcium supplement or vitamin D to reduce the rate of osteoporosis.  Menopause can be associated with physical symptoms and risks. Hormone replacement therapy is available to decrease symptoms and risks. You should talk to your caregiver about whether hormone replacement therapy is right for you.  Use sunscreen. Apply sunscreen liberally and repeatedly throughout the day. You should seek shade when your shadow is shorter than you. Protect yourself by wearing long sleeves, pants, a wide-brimmed hat, and sunglasses year round, whenever you are outdoors.  Notify your caregiver of new moles or changes in moles, especially if there is a change in shape or color. Also notify your caregiver if a mole is larger than the size of a pencil eraser.  Stay current with your immunizations. Document Released: 06/30/2011 Document Revised: 04/11/2013 Document Reviewed: 06/30/2011 [employer] Patient Information 2014 [employer].    

## 2014-09-13 NOTE — Addendum Note (Signed)
Addended by: Nelva Nay on: 09/13/2014 09:44 AM   Modules accepted: Orders

## 2014-09-14 LAB — CYTOLOGY - PAP

## 2014-10-30 ENCOUNTER — Encounter: Payer: Self-pay | Admitting: Gynecology

## 2014-11-30 ENCOUNTER — Other Ambulatory Visit: Payer: Self-pay

## 2014-11-30 DIAGNOSIS — Z1231 Encounter for screening mammogram for malignant neoplasm of breast: Secondary | ICD-10-CM

## 2014-12-13 ENCOUNTER — Ambulatory Visit: Payer: BC Managed Care – PPO

## 2014-12-15 ENCOUNTER — Ambulatory Visit
Admission: RE | Admit: 2014-12-15 | Discharge: 2014-12-15 | Disposition: A | Discharge status: Home / Self Care | Payer: Medicare Other | Source: Ambulatory Visit

## 2014-12-15 DIAGNOSIS — Z1231 Encounter for screening mammogram for malignant neoplasm of breast: Secondary | ICD-10-CM

## 2015-01-10 ENCOUNTER — Other Ambulatory Visit: Payer: Self-pay | Admitting: Family Medicine

## 2015-01-10 ENCOUNTER — Ambulatory Visit
Admission: RE | Admit: 2015-01-10 | Discharge: 2015-01-10 | Disposition: A | Discharge status: Home / Self Care | Payer: PRIVATE HEALTH INSURANCE | Source: Ambulatory Visit | Attending: Family Medicine | Admitting: Family Medicine

## 2015-01-10 DIAGNOSIS — R079 Chest pain, unspecified: Secondary | ICD-10-CM

## 2015-09-19 NOTE — H&P (Addendum)
OFFICE VISIT NOTES COPIED TO EPIC FOR DOCUMENTATION  Gina Fernandez. Messenger Sep 25, 2015 8:30 AM Location: [facility] Patient #: [id number] DOB: [birth year] Single / Language: Gina Fernandez / Race: White Female   History of Present Illness Laverda Page MD; 2015-09-25 1:48 PM) Patient words: 6 mo f/u for angina;Marland Kitchen  The patient is a 64 year old female who presents for a follow-up for Chest pain. She has a history of HTN and hyperlipidemia. She has a family history of heart disease, father died of heart attack at age 40, brother had CABG at age 28, and sister was diagnosed with heart failure. She presents today for f/u evaluation of chest pain since December 2015. She states the pain usually occurs around 4am and wakes her from her sleep. She reports associated SOB but denies any nausea or diaphoresis. She describes the pain as a heaviness in the center of her chest, without radiation, that resolves within minutes.   She is here for his six-month office visit and follow-up of chest pain, mostly occurring around 4:00 in the morning at night, with radiation to her neck and also to her arms. She is also noticed exertional chest tightness recently and she has been concerned about this. She also states that over the past 4 months she has noticed marked dyspnea even during routine chores, walking up even a few stairs in her house, she has to stop and rest. She has recently joined a running group and notices marked dyspnea with activity. No PND or orthopnea.  Patient states that her symptoms of anginal has improved significantly since being on medical therapy, but still having about 2-3 chest pain episodes. She is also worried about exertional dyspnea that has recently started. She has not used any sublingual nitroglycerin.  She denies orthopnea, dizziness, edema, headache, visual disturbances, or symptoms suggestive of claudication or TIA.    Problem List/Past Medical Adonis Brook Beane;  09-25-15 12:46 PM) Atypical chest pain (R07.89) Treadmill Exercise stress 02/19/2015: Indications: Assessment of Chest Pain. Conclusions: Negative for ischemia. Good effort. The patient exercised according to the Bruce protocol, Total time recorded 8 Min. 0 sec. achieving a max heart rate of 154 which was 100% of MPHR for age and 9.1 METS of work. Baseline NIBP was 110/70. Peak NIBP was 110/70 MaxSysp was: 166 MaxDiasp was: 80. The baseline ECG showed NSR,RBBB. During exercise there was No ST-T changes of ischemia. Symptoms: Dyspnea, nausea. Achieved 100% MPHR. Arrhythmia: Rare PVC. Continue primary prevention. Family history of premature CAD (Z82.49) Essential hypertension (I10) Labs 01/10/2015: HbA1c 5.5%, creatinine 0.81, CMP normal Hyperlipidemia, group A (E78.0) Labs 01/10/2015: total cholesterol 274, triglycerides 153, HDL 94, LDL 149 Nocturnal angina (I20.8) Vitamin D deficiency (E55.9) Labs 01/10/2015: vitamin D 21.8 Atrial septal defect, secundum (Q21.1)02/28/2015 Echocardiogram 02/13/2015: Left ventricle cavity is normal in size. Mild concentric hypertrophy of the left ventricle. Normal global wall motion. Normal diastolic filling pattern. Calculated EF 65%. Left atrial cavity is normal in size. Severe aneurysmal motion of the atrial septum, cannot exclude presence of a patent foramen ovale/fenestrated ASD. Septum bulges to the left suggests elevated right heart pressure. Trace tricuspid regurgitation. No evidence of pulmonary hypertension.  Allergies Adonis Brook Beane; 09/25/15 12:46 PM) No Known Drug Allergies02/04/2015  Family History Franky Macho Reader; September 25, 2015 1:00 PM) Mother Deceased. at age 35, from Aortic Anuerysm Surgery. No konwn Heart Conditions Father Deceased. at age 25, from MI. Known Heart Conditions, Hx of Heart Attacks Sister 2 Sister 2- 2 years younger Brother 82 37 years older, CABG at  age 24 Sister 1 Deceased. at age 60 from CHF; alcohol induced heart failure, no  strokes or heart attacks no other cardiovascular conditions; 7 yrs older  Social History Adonis Brook Beane; 09-17-2015 12:46 PM) Current tobacco use Never smoker. Alcohol Use Occasional alcohol use. Marital status Single. Living Situation Lives alone. Number of Children 1.  Past Surgical History Adonis Brook Beane; September 17, 2015 12:46 PM) None02/04/2015  Medication History (Charavina Reader; 09/17/2015 1:02 PM) Atorvastatin Calcium (20MG  Tablet, 1 (one) Tablet Tablet Oral daily, Taken starting 05/04/2015) Active. Carvedilol (6.25MG  Tablet, 1 (one) Tablet Oral two times daily, Taken starting 04/16/2015) Active. (Discontinued Amlodipine) Aspirin Adult Low Dose (81MG  Tablet DR, 1 (one) Tablet DR Oral daily, Taken starting 02/28/2015) Active. ALPRAZolam (0.5MG  Tablet, 1 Oral three times daily as needed) Active. Triazolam (0.25MG  Tablet, 2 Oral at bedtime) Active. Venlafaxine HCl ER (150MG  Capsule ER 24HR, 2 Oral daily) Active. Multiple Vitamin (1 (one) Oral daily) Active. Telmisartan (40MG  Tablet, 1 Oral daily) Active. Tylenol Extra Strength (500MG  Tablet, 1 Oral as needed for pain) Active. Vitamin D3 (2000UNIT Capsule, 2 Oral daily) Active. Medications Reconciled  Diagnostic Studies History Adonis Brook Beane; 09-17-15 12:46 PM) Treadmill stress test2000 Colonoscopy2008 Echocardiogram02/16/2016 Left ventricle cavity is normal in size. Mild concentric hypertrophy of the left ventricle. Normal global wall motion. Normal diastolic filling pattern. Calculated EF 65%. Left atrial cavity is normal in size. Severe aneurysmal motion of the atrial septum, cannot exclude presence of a patent foramen ovale/fenestrated ASD. Septum bulges to the left suggests elevated right heart pressure. Trace tricuspid regurgitation. No evidence of pulmonary hypertension.    Review of Systems Laverda Page MD; 09-17-2015 1:49 PM) General Not Present- Anorexia, Fatigue and Fever. Respiratory Present-  Decreased Exercise Tolerance and Difficulty Breathing on Exertion. Not Present- Cough and Wakes up from Sleep Wheezing or Short of Breath. Cardiovascular Present- Chest Pain (nocturnal angina) and Shortness of Breath. Not Present- Claudications, Edema, Orthopnea, Palpitations and Paroxysmal Nocturnal Dyspnea. Gastrointestinal Not Present- Change in Bowel Habits, Constipation and Nausea. Neurological Not Present- Focal Neurological Symptoms. Endocrine Not Present- Appetite Changes, Cold Intolerance and Heat Intolerance. Hematology Not Present- Anemia, Petechiae and Prolonged Bleeding.  Vitals Franky Macho Reader; 09-17-15 1:04 PM) Sep 17, 2015 12:47 PM Weight: 129.19 lb Height: 62in Body Surface Area: 1.59 m Body Mass Index: 23.63 kg/m  Pulse: 78 (Regular)  P.OX: 96% (Room air) BP: 122/88 (Sitting, Left Arm, Standard)       Physical Exam Laverda Page MD; Sep 17, 2015 3:05 PM) General Mental Status-Alert. General Appearance-Cooperative, Appears stated age, Not in acute distress. Orientation-Oriented X3. Build & Nutrition-Well nourished and Moderately built.  Head and Neck Thyroid Gland Characteristics - no palpable nodules, no palpable enlargement.  Chest and Lung Exam Chest and lung exam reveals -normal excursion with symmetric chest walls, quiet, even and easy respiratory effort with no use of accessory muscles and on auscultation, normal breath sounds, no adventitious sounds and normal vocal resonance. Palpation Tender - No chest wall tenderness.  Cardiovascular Cardiovascular examination reveals -on palpation PMI is normal in location and amplitude, no palpable S3 or S4. Normal cardiac borders., normal heart sounds, regular rate and rhythm with no murmurs, carotid auscultation reveals no bruits, abdominal aorta auscultation reveals no bruits, femoral artery auscultation bilaterally reveals normal pulses, no bruits, no thrills, normal pedal pulses bilaterally  and no digital clubbing, cyanosis, edema, increased warmth or tenderness. Inspection Jugular vein - Right - No Distention. Auscultation  Abdomen Palpation/Percussion Normal exam - Non Tender and No hepatosplenomegaly. Auscultation Normal exam - Bowel sounds normal.  Neurologic Motor-Grossly intact without any focal deficits.  Musculoskeletal Global Assessment Left Lower Extremity - normal range of motion without pain. Right Lower Extremity - normal range of motion without pain.    Assessment & Plan (Devonna Shumate; 08/31/2015 3:53 PM) Nocturnal angina (I20.8) Atypical chest pain (R07.89) Story: Treadmill Exercise stress 02/19/2015: Indications: Assessment of Chest Pain. Conclusions: Negative for ischemia. Good effort. The patient exercised according to the Bruce protocol, Total time recorded 8 Min. achieving a max heart rate of 154 which was 100% of MPHR for age and 9.1 METS of work. Baseline NIBP was 110/70. Peak NIBP was 110/70 MaxSysp was: 166 MaxDiasp was: 80. The baseline ECG showed NSR,RBBB. During exercise there was No ST-T changes of ischemia. Symptoms: Dyspnea, nausea. Achieved 100% MPHR. Arrhythmia: Rare PVC. Continue primary prevention. Impression: EKG 08/31/2015: Normal sinus rhythm at the rate of 61 bpm, left axis deviation, left anterior fascicular block. Right bundle branch block. Pulmonary disease pattern. No significant change from EKG 02/02/2015. Current Plans Started Omeprazole 40MG , 1 (one) Capsule DR once daily 30 minutes before meal, #30, 08/31/2015, Ref. x2. METABOLIC PANEL, COMPREHENSIVE (80053) TSH (THYROID STIMULATING HORMONE) (10175) Future Plans 09/10/2015: Myocardial perfusion imaging, tomographic (SPECT) (including attenuation correction, qualitative or quantitative wall motion, ejection fraction by first pass or gated technique, additional quantification, when performed) - one time Shortness of breath on exertion (R06.02) Essential hypertension  (I10) Story: Labs 01/10/2015: HbA1c 5.5%, creatinine 0.81, CMP normal Atrial septal defect, secundum (Q21.1) Story: Echocardiogram 02/13/2015: Left ventricle cavity is normal in size. Mild concentric hypertrophy of the left ventricle. Normal global wall motion. Normal diastolic filling pattern. Calculated EF 65%. Left atrial cavity is normal in size. Severe aneurysmal motion of the atrial septum, cannot exclude presence of a patent foramen ovale/fenestrated ASD. Septum bulges to the left suggests elevated right heart pressure. Trace tricuspid regurgitation. No evidence of pulmonary hypertension. Family history of premature CAD (Z82.49) Story: CAD and myocardial infarction in father at the age of 7 years. One brother with CABG at age 52 years. Hyperlipidemia, group A (E78.0) Story: Labs 01/10/2015: total cholesterol 274, triglycerides 153, HDL 94, LDL 149 Current Plans Mechanism of underlying disease process and action of medications discussed with the patient. I discussed primary/secondary prevention and also dietary counceling was done. Patient here for a six-month office visit and follow-up of chest pain which is atypical qualities but also chest pain suggestive nocturnal angina pectoris. Although her symptoms have improved being on carvedilol and also statins, she still has significant symptoms of chest pain although she has not used any sublingual nitroglycerin. Also has noticed new onset of marked dyspnea. I again reviewed the results and the images of the echocardiogram revealing significant intra-atrial septal aneurysmal dilatation with shift towards the left suggesting elevated right-sided pressure and fenestrated atrial separate defect is probably present. This may in fact induce dyspnea. Hence I recommended a TEE to further evaluate her cardiac anatomy.  Although she had routine treadmill stress test that was negative for myocardial ischemia 6 months ago, due to persistent exertional chest pain  and symptoms suggestive of angina pectoris, I have recommended exercise sestamibi stress test. No changes in her medications were done today. I will see her back after these tests and make further recommendations. Obtain CMP, Lipids and TSH.  Schedule for TEE to better evaluate the structural abnormality. I have explained risks, benefits,alternatives to TEE, including but not limited to rare esophageal perforation, bleeding, aspiration pneumonia.  LIPOPROTEIN, BLD, BY NMR (10258)   Signed by Cammy Brochure  Carlynn Herald, MD (08/31/2015 4:24 PM)  Exercise myoview stress 09/17/2015: 1. The resting electrocardiogram demonstrated normal sinus rhythm, incomplete RBBB and no resting arrhythmias. The stress electrocardiogram was normal. Patient exercised for 6 minutes and achieved 7.05 Mets. Test terminated due to dyspnea and achieving THR, 86% of MPHR. 2. Myocardial perfusion imaging is normal. Overall left ventricular systolic function was normal without regional wall motion abnormalities. The left ventricular ejection fraction was 63%.

## 2015-09-25 ENCOUNTER — Ambulatory Visit (HOSPITAL_COMMUNITY)
Admission: RE | Admit: 2015-09-25 | Discharge: 2015-09-25 | Disposition: A | Discharge status: Home / Self Care | Payer: Medicare Other | Source: Ambulatory Visit | Attending: Cardiology | Admitting: Cardiology

## 2015-09-25 ENCOUNTER — Encounter (HOSPITAL_COMMUNITY)
Admission: RE | Disposition: A | Discharge status: Home / Self Care | Payer: Self-pay | Source: Ambulatory Visit | Attending: Cardiology

## 2015-09-25 ENCOUNTER — Encounter (HOSPITAL_COMMUNITY): Payer: Self-pay | Admitting: *Deleted

## 2015-09-25 DIAGNOSIS — R0789 Other chest pain: Secondary | ICD-10-CM | POA: Diagnosis present

## 2015-09-25 DIAGNOSIS — Z8249 Family history of ischemic heart disease and other diseases of the circulatory system: Secondary | ICD-10-CM | POA: Diagnosis not present

## 2015-09-25 DIAGNOSIS — I253 Aneurysm of heart: Secondary | ICD-10-CM | POA: Diagnosis not present

## 2015-09-25 DIAGNOSIS — Z7982 Long term (current) use of aspirin: Secondary | ICD-10-CM | POA: Insufficient documentation

## 2015-09-25 DIAGNOSIS — Z79899 Other long term (current) drug therapy: Secondary | ICD-10-CM | POA: Insufficient documentation

## 2015-09-25 DIAGNOSIS — E780 Pure hypercholesterolemia: Secondary | ICD-10-CM | POA: Insufficient documentation

## 2015-09-25 DIAGNOSIS — R0600 Dyspnea, unspecified: Secondary | ICD-10-CM | POA: Insufficient documentation

## 2015-09-25 DIAGNOSIS — E559 Vitamin D deficiency, unspecified: Secondary | ICD-10-CM | POA: Diagnosis not present

## 2015-09-25 DIAGNOSIS — I10 Essential (primary) hypertension: Secondary | ICD-10-CM | POA: Diagnosis not present

## 2015-09-25 HISTORY — PX: TEE WITHOUT CARDIOVERSION: SHX5443

## 2015-09-25 SURGERY — ECHOCARDIOGRAM, TRANSESOPHAGEAL
Anesthesia: Moderate Sedation

## 2015-09-25 MED ORDER — MIDAZOLAM HCL 10 MG/2ML IJ SOLN
INTRAMUSCULAR | Status: DC | PRN
  Administered 2015-09-25: 1 mg via INTRAVENOUS
  Administered 2015-09-25: 3 mg via INTRAVENOUS
  Administered 2015-09-25 (×2): 1 mg via INTRAVENOUS

## 2015-09-25 MED ORDER — SODIUM CHLORIDE 0.9 % IV SOLN
INTRAVENOUS | Status: DC
  Administered 2015-09-25: 12:00:00 via INTRAVENOUS

## 2015-09-25 MED ORDER — MIDAZOLAM HCL 5 MG/ML IJ SOLN
INTRAMUSCULAR | Status: AC
  Filled 2015-09-25: qty 2

## 2015-09-25 MED ORDER — FENTANYL CITRATE (PF) 100 MCG/2ML IJ SOLN
INTRAMUSCULAR | Status: DC | PRN
  Administered 2015-09-25 (×2): 25 ug via INTRAVENOUS

## 2015-09-25 MED ORDER — FENTANYL CITRATE (PF) 100 MCG/2ML IJ SOLN
INTRAMUSCULAR | Status: AC
  Filled 2015-09-25: qty 2

## 2015-09-25 MED ORDER — BUTAMBEN-TETRACAINE-BENZOCAINE 2-2-14 % EX AERO
INHALATION_SPRAY | CUTANEOUS | Status: DC | PRN
  Administered 2015-09-25 (×2): 1 via TOPICAL

## 2015-09-25 MED ORDER — DIPHENHYDRAMINE HCL 50 MG/ML IJ SOLN
INTRAMUSCULAR | Status: AC
  Filled 2015-09-25: qty 1

## 2015-09-25 NOTE — Interval H&P Note (Signed)
History and Physical Interval Note:  09/25/2015 1:06 PM  Gina Fernandez  has presented today for surgery, with the diagnosis of ASD  The various methods of treatment have been discussed with the patient and family. After consideration of risks, benefits and other options for treatment, the patient has consented to  Procedure(s): TRANSESOPHAGEAL ECHOCARDIOGRAM (TEE) (N/A) as a surgical intervention .  The patient's history has been reviewed, patient examined, no change in status, stable for surgery.  I have reviewed the patient's chart and labs.  Questions were answered to the patient's satisfaction.     Adrian Prows

## 2015-09-25 NOTE — Discharge Instructions (Signed)
Transesophageal Echocardiogram °Transesophageal echocardiography (TEE) is a special type of test that produces images of the heart by using sound waves (echocardiogram). This type of echocardiography can obtain better images of the heart than standard echocardiography. TEE is done by passing a flexible tube down the esophagus. The heart is located in front of the esophagus. Because the heart and esophagus are close to one another, your health care provider can take very clear, detailed pictures of the heart via ultrasound waves. °TEE may be done: °· If your health care provider needs more information based on standard echocardiography findings. °· If you had a stroke. This might have happened because a clot formed in your heart. TEE can visualize different areas of the heart and check for clots. °· To check valve anatomy and function. °· To check for infection on the inside of your heart (endocarditis). °· To evaluate the dividing wall (septum) of the heart and presence of a hole that did not close after birth (patent foramen ovale or atrial septal defect). °· To help diagnose a tear in the wall of the aorta (aortic dissection). °· During cardiac valve surgery. This allows the surgeon to assess the valve repair before closing the chest. °· During a variety of other cardiac procedures to guide positioning of catheters. °· Sometimes before a cardioversion, which is a shock to convert heart rhythm back to normal. °LET YOUR HEALTH CARE PROVIDER KNOW ABOUT:  °· Any allergies you have. °· All medicines you are taking, including vitamins, herbs, eye drops, creams, and over-the-counter medicines. °· Previous problems you or members of your family have had with the use of anesthetics. °· Any blood disorders you have. °· Previous surgeries you have had. °· Medical conditions you have. °· Swallowing difficulties. °· An esophageal obstruction. °RISKS AND COMPLICATIONS  °Generally, TEE is a safe procedure. However, as with any  procedure, complications can occur. Possible complications include an esophageal tear (rupture). °BEFORE THE PROCEDURE  °· Do not eat or drink for 6 hours before the procedure or as directed by your health care provider. °· Arrange for someone to drive you home after the procedure. Do not drive yourself home. During the procedure, you will be given medicines that can continue to make you feel drowsy and can impair your reflexes. °· An IV access tube will be started in the arm. °PROCEDURE  °· A medicine to help you relax (sedative) will be given through the IV access tube. °· A medicine may be sprayed or gargled to numb the back of the throat. °· Your blood pressure, heart rate, and breathing (vital signs) will be monitored during the procedure. °· The TEE probe is a long, flexible tube. The tip of the probe is placed into the back of the mouth, and you will be asked to swallow. This helps to pass the tip of the probe into the esophagus. Once the tip of the probe is in the correct area, your health care provider can take pictures of the heart. °· TEE is usually not a painful procedure. You may feel the probe press against the back of the throat. The probe does not enter the trachea and does not affect your breathing. °AFTER THE PROCEDURE  °· You will be in bed, resting, until you have fully returned to consciousness. °· When you first awaken, your throat may feel slightly sore and will probably still feel numb. This will improve slowly over time. °· You will not be allowed to eat or drink until it   is clear that the numbness has improved. °· Once you have been able to drink, urinate, and sit on the edge of the bed without feeling sick to your stomach (nausea) or dizzy, you may be cleared to go home. °· You should have a friend or family member with you for the next 24 hours after your procedure. °Document Released: 03/07/2003 Document Revised: 12/20/2013 Document Reviewed: 06/16/2013 °[employer]® Patient Information  ©2015 [employer]. This information is not intended to replace advice given to you by your health care provider. Make sure you discuss any questions you have with your health care provider. ° °

## 2015-09-25 NOTE — Progress Notes (Signed)
  Echocardiogram Echocardiogram Transesophageal With Bubble Study  has been performed.  Gina Fernandez M 09/25/2015, 1:43 PM

## 2015-09-27 ENCOUNTER — Encounter (HOSPITAL_COMMUNITY): Payer: Self-pay | Admitting: Cardiology

## 2015-10-16 ENCOUNTER — Other Ambulatory Visit (HOSPITAL_COMMUNITY): Payer: Self-pay | Admitting: Psychiatry

## 2016-01-09 ENCOUNTER — Other Ambulatory Visit: Payer: Self-pay

## 2016-01-09 DIAGNOSIS — Z1231 Encounter for screening mammogram for malignant neoplasm of breast: Secondary | ICD-10-CM

## 2016-01-29 ENCOUNTER — Ambulatory Visit: Payer: Self-pay | Admitting: Family Medicine

## 2016-01-30 ENCOUNTER — Ambulatory Visit
Admission: RE | Admit: 2016-01-30 | Discharge: 2016-01-30 | Disposition: A | Discharge status: Home / Self Care | Payer: Medicare Other | Source: Ambulatory Visit

## 2016-01-30 DIAGNOSIS — Z1231 Encounter for screening mammogram for malignant neoplasm of breast: Secondary | ICD-10-CM

## 2016-01-31 ENCOUNTER — Other Ambulatory Visit: Payer: Self-pay | Admitting: Gynecology

## 2016-01-31 DIAGNOSIS — R234 Changes in skin texture: Secondary | ICD-10-CM

## 2016-01-31 DIAGNOSIS — N6459 Other signs and symptoms in breast: Secondary | ICD-10-CM

## 2016-02-11 ENCOUNTER — Ambulatory Visit
Admission: RE | Admit: 2016-02-11 | Discharge: 2016-02-11 | Disposition: A | Discharge status: Home / Self Care | Payer: Medicare Other | Source: Ambulatory Visit | Attending: Gynecology | Admitting: Gynecology

## 2016-02-11 DIAGNOSIS — R234 Changes in skin texture: Secondary | ICD-10-CM

## 2016-02-11 DIAGNOSIS — N6459 Other signs and symptoms in breast: Secondary | ICD-10-CM

## 2016-02-21 ENCOUNTER — Encounter: Payer: Self-pay | Admitting: Gynecology

## 2016-02-21 ENCOUNTER — Ambulatory Visit (INDEPENDENT_AMBULATORY_CARE_PROVIDER_SITE_OTHER): Payer: Medicare Other | Admitting: Gynecology

## 2016-02-21 VITALS — BP 120/74 | Ht 61.5 in | Wt 132.0 lb

## 2016-02-21 DIAGNOSIS — M810 Age-related osteoporosis without current pathological fracture: Secondary | ICD-10-CM | POA: Diagnosis not present

## 2016-02-21 DIAGNOSIS — N952 Postmenopausal atrophic vaginitis: Secondary | ICD-10-CM | POA: Diagnosis not present

## 2016-02-21 DIAGNOSIS — Z01419 Encounter for gynecological examination (general) (routine) without abnormal findings: Secondary | ICD-10-CM | POA: Diagnosis not present

## 2016-02-21 NOTE — Patient Instructions (Signed)
Follow up for bone density as scheduled.  Make sure that your primary physicians are following your vitamin D level  Check with your gastroenterologist as to when your next colon screening test should be done  You may obtain a copy of any labs that were done today by logging onto MyChart as outlined in the instructions provided with your AVS (after visit summary). The office will not call with normal lab results but certainly if there are any significant abnormalities then we will contact you.   Health Maintenance Adopting a healthy lifestyle and getting preventive care can go a long way to promote health and wellness. Talk with your health care provider about what schedule of regular examinations is right for you. This is a good chance for you to check in with your provider about disease prevention and staying healthy. In between checkups, there are plenty of things you can do on your own. Experts have done a lot of research about which lifestyle changes and preventive measures are most likely to keep you healthy. Ask your health care provider for more information. WEIGHT AND DIET  Eat a healthy diet  Be sure to include plenty of vegetables, fruits, low-fat dairy products, and lean protein.  Do not eat a lot of foods high in solid fats, added sugars, or salt.  Get regular exercise. This is one of the most important things you can do for your health.  Most adults should exercise for at least 150 minutes each week. The exercise should increase your heart rate and make you sweat (moderate-intensity exercise).  Most adults should also do strengthening exercises at least twice a week. This is in addition to the moderate-intensity exercise.  Maintain a healthy weight  Body mass index (BMI) is a measurement that can be used to identify possible weight problems. It estimates body fat based on height and weight. Your health care provider can help determine your BMI and help you achieve or maintain  a healthy weight.  For females 49 years of age and older:   A BMI below 18.5 is considered underweight.  A BMI of 18.5 to 24.9 is normal.  A BMI of 25 to 29.9 is considered overweight.  A BMI of 30 and above is considered obese.  Watch levels of cholesterol and blood lipids  You should start having your blood tested for lipids and cholesterol at 67 years of age, then have this test every 5 years.  You may need to have your cholesterol levels checked more often if:  Your lipid or cholesterol levels are high.  You are older than 67 years of age.  You are at high risk for heart disease.  CANCER SCREENING   Lung Cancer  Lung cancer screening is recommended for adults 58-16 years old who are at high risk for lung cancer because of a history of smoking.  A yearly low-dose CT scan of the lungs is recommended for people who:  Currently smoke.  Have quit within the past 15 years.  Have at least a 30-pack-year history of smoking. A pack year is smoking an average of one pack of cigarettes a day for 1 year.  Yearly screening should continue until it has been 15 years since you quit.  Yearly screening should stop if you develop a health problem that would prevent you from having lung cancer treatment.  Breast Cancer  Practice breast self-awareness. This means understanding how your breasts normally appear and feel.  It also means doing regular breast self-exams.  Let your health care provider know about any changes, no matter how small.  If you are in your 20s or 30s, you should have a clinical breast exam (CBE) by a health care provider every 1-3 years as part of a regular health exam.  If you are 87 or older, have a CBE every year. Also consider having a breast X-ray (mammogram) every year.  If you have a family history of breast cancer, talk to your health care provider about genetic screening.  If you are at high risk for breast cancer, talk to your health care provider  about having an MRI and a mammogram every year.  Breast cancer gene (BRCA) assessment is recommended for women who have family members with BRCA-related cancers. BRCA-related cancers include:  Breast.  Ovarian.  Tubal.  Peritoneal cancers.  Results of the assessment will determine the need for genetic counseling and BRCA1 and BRCA2 testing. Cervical Cancer Routine pelvic examinations to screen for cervical cancer are no longer recommended for nonpregnant women who are considered low risk for cancer of the pelvic organs (ovaries, uterus, and vagina) and who do not have symptoms. A pelvic examination may be necessary if you have symptoms including those associated with pelvic infections. Ask your health care provider if a screening pelvic exam is right for you.   The Pap test is the screening test for cervical cancer for women who are considered at risk.  If you had a hysterectomy for a problem that was not cancer or a condition that could lead to cancer, then you no longer need Pap tests.  If you are older than 65 years, and you have had normal Pap tests for the past 10 years, you no longer need to have Pap tests.  If you have had past treatment for cervical cancer or a condition that could lead to cancer, you need Pap tests and screening for cancer for at least 20 years after your treatment.  If you no longer get a Pap test, assess your risk factors if they change (such as having a new sexual partner). This can affect whether you should start being screened again.  Some women have medical problems that increase their chance of getting cervical cancer. If this is the case for you, your health care provider may recommend more frequent screening and Pap tests.  The human papillomavirus (HPV) test is another test that may be used for cervical cancer screening. The HPV test looks for the virus that can cause cell changes in the cervix. The cells collected during the Pap test can be tested for  HPV.  The HPV test can be used to screen women 7 years of age and older. Getting tested for HPV can extend the interval between normal Pap tests from three to five years.  An HPV test also should be used to screen women of any age who have unclear Pap test results.  After 67 years of age, women should have HPV testing as often as Pap tests.  Colorectal Cancer  This type of cancer can be detected and often prevented.  Routine colorectal cancer screening usually begins at 67 years of age and continues through 67 years of age.  Your health care provider may recommend screening at an earlier age if you have risk factors for colon cancer.  Your health care provider may also recommend using home test kits to check for hidden blood in the stool.  A small camera at the end of a tube can be used  to examine your colon directly (sigmoidoscopy or colonoscopy). This is done to check for the earliest forms of colorectal cancer.  Routine screening usually begins at age 32.  Direct examination of the colon should be repeated every 5-10 years through 67 years of age. However, you may need to be screened more often if early forms of precancerous polyps or small growths are found. Skin Cancer  Check your skin from head to toe regularly.  Tell your health care provider about any new moles or changes in moles, especially if there is a change in a mole's shape or color.  Also tell your health care provider if you have a mole that is larger than the size of a pencil eraser.  Always use sunscreen. Apply sunscreen liberally and repeatedly throughout the day.  Protect yourself by wearing long sleeves, pants, a wide-brimmed hat, and sunglasses whenever you are outside. HEART DISEASE, DIABETES, AND HIGH BLOOD PRESSURE   Have your blood pressure checked at least every 1-2 years. High blood pressure causes heart disease and increases the risk of stroke.  If you are between 53 years and 33 years old, ask  your health care provider if you should take aspirin to prevent strokes.  Have regular diabetes screenings. This involves taking a blood sample to check your fasting blood sugar level.  If you are at a normal weight and have a low risk for diabetes, have this test once every three years after 67 years of age.  If you are overweight and have a high risk for diabetes, consider being tested at a younger age or more often. PREVENTING INFECTION  Hepatitis B  If you have a higher risk for hepatitis B, you should be screened for this virus. You are considered at high risk for hepatitis B if:  You were born in a country where hepatitis B is common. Ask your health care provider which countries are considered high risk.  Your parents were born in a high-risk country, and you have not been immunized against hepatitis B (hepatitis B vaccine).  You have HIV or AIDS.  You use needles to inject street drugs.  You live with someone who has hepatitis B.  You have had sex with someone who has hepatitis B.  You get hemodialysis treatment.  You take certain medicines for conditions, including cancer, organ transplantation, and autoimmune conditions. Hepatitis C  Blood testing is recommended for:  Everyone born from [birth year] through 1967.  Anyone with known risk factors for hepatitis C. Sexually transmitted infections (STIs)  You should be screened for sexually transmitted infections (STIs) including gonorrhea and chlamydia if:  You are sexually active and are younger than 67 years of age.  You are older than 67 years of age and your health care provider tells you that you are at risk for this type of infection.  Your sexual activity has changed since you were last screened and you are at an increased risk for chlamydia or gonorrhea. Ask your health care provider if you are at risk.  If you do not have HIV, but are at risk, it may be recommended that you take a prescription medicine daily to  prevent HIV infection. This is called pre-exposure prophylaxis (PrEP). You are considered at risk if:  You are sexually active and do not regularly use condoms or know the HIV status of your partner(s).  You take drugs by injection.  You are sexually active with a partner who has HIV. Talk with your health care provider  about whether you are at high risk of being infected with HIV. If you choose to begin PrEP, you should first be tested for HIV. You should then be tested every 3 months for as long as you are taking PrEP.  PREGNANCY   If you are premenopausal and you may become pregnant, ask your health care provider about preconception counseling.  If you may become pregnant, take 400 to 800 micrograms (mcg) of folic acid every day.  If you want to prevent pregnancy, talk to your health care provider about birth control (contraception). OSTEOPOROSIS AND MENOPAUSE   Osteoporosis is a disease in which the bones lose minerals and strength with aging. This can result in serious bone fractures. Your risk for osteoporosis can be identified using a bone density scan.  If you are 9 years of age or older, or if you are at risk for osteoporosis and fractures, ask your health care provider if you should be screened.  Ask your health care provider whether you should take a calcium or vitamin D supplement to lower your risk for osteoporosis.  Menopause may have certain physical symptoms and risks.  Hormone replacement therapy may reduce some of these symptoms and risks. Talk to your health care provider about whether hormone replacement therapy is right for you.  HOME CARE INSTRUCTIONS   Schedule regular health, dental, and eye exams.  Stay current with your immunizations.   Do not use any tobacco products including cigarettes, chewing tobacco, or electronic cigarettes.  If you are pregnant, do not drink alcohol.  If you are breastfeeding, limit how much and how often you drink  alcohol.  Limit alcohol intake to no more than 1 drink per day for nonpregnant women. One drink equals 12 ounces of beer, 5 ounces of wine, or 1 ounces of hard liquor.  Do not use street drugs.  Do not share needles.  Ask your health care provider for help if you need support or information about quitting drugs.  Tell your health care provider if you often feel depressed.  Tell your health care provider if you have ever been abused or do not feel safe at home. Document Released: 06/30/2011 Document Revised: 05/01/2014 Document Reviewed: 11/16/2013 [facility] Patient Information 2015 [place], [place]. This information is not intended to replace advice given to you by your health care provider. Make sure you discuss any questions you have with your health care provider.

## 2016-02-21 NOTE — Progress Notes (Signed)
Gina Fernandez 07-Sep-1951 [id number]        65 y.o.  G1P1  For breast and pelvic exam.  Past medical history,surgical history, problem list, medications, allergies, family history and social history were all reviewed and documented as reviewed in the EPIC chart.  ROS:  Performed with pertinent positives and negatives included in the history, assessment and plan.   Additional significant findings :  none   Exam:  Gilman Schmidt assistant Filed Vitals:   02/21/16 1102  BP: 120/74  Height: 5' 1.5" (1.562 m)  Weight: 132 lb (59.875 kg)   General appearance:  Normal affect, orientation and appearance. Skin: Grossly normal HEENT: Without gross lesions.  No cervical or supraclavicular adenopathy. Thyroid normal.  Lungs:  Clear without wheezing, rales or rhonchi Cardiac: RR, without RMG Abdominal:  Soft, nontender, without masses, guarding, rebound, organomegaly or hernia Breasts:  Examined lying and sitting without masses, retractions, discharge or axillary adenopathy. Pelvic:  Ext/BUS/vagina with atrophic changes  Cervix with atrophic changes  Uterus axial, normal size, shape and contour, midline and mobile nontender   Adnexa without masses or tenderness    Anus and perineum normal   Rectovaginal normal sphincter tone without palpated masses or tenderness.    Assessment/Plan:  65 y.o. G1P1 female for breast and pelvic exam  1. Postmenopausal/atrophic changes. Without significant hot flashes, night sweats, vaginal dryness or any vaginal bleeding. Continue monitoring for any issues or bleeding. 2. Osteoporosis. DEXA 12/2012 T score -3.5.  Schedule follow up DEXA now. History of bisphosphonate use 6-7 years discontinued 2012. Having vitamin D followed by her primary and reports low currently on supplementation. She'll follow up with them and adjust as needed. 3. Mammography 01/2016. Continue with annual mammography when due. SBE monthly reviewed. 4. History of incompleted colonoscopy with  sigmoidoscopy 12 years ago. Recommended patient follow up with her gastroenterologist as I have in the past and she has not done to discuss colon screening options and she agrees to call [facility] gastroenterology to arrange. 5. Pap smear 08/2014.  No Pap smear done today. No history of significant abnormal Pap smears. 6. Health maintenance. No routine blood work done as this is done at her primary physician's office. Follow up for DEXA as scheduled otherwise annual exam in one year   Anastasio Auerbach MD, 11:22 AM 02/21/2016

## 2016-06-03 ENCOUNTER — Encounter: Payer: Self-pay | Admitting: Neurology

## 2016-06-03 ENCOUNTER — Ambulatory Visit (INDEPENDENT_AMBULATORY_CARE_PROVIDER_SITE_OTHER): Payer: Medicare Other | Admitting: Neurology

## 2016-06-03 VITALS — BP 138/82 | HR 72 | Resp 16 | Ht 62.0 in | Wt 128.0 lb

## 2016-06-03 DIAGNOSIS — G4763 Sleep related bruxism: Secondary | ICD-10-CM

## 2016-06-03 DIAGNOSIS — R519 Headache, unspecified: Secondary | ICD-10-CM

## 2016-06-03 DIAGNOSIS — R0683 Snoring: Secondary | ICD-10-CM | POA: Diagnosis not present

## 2016-06-03 DIAGNOSIS — R51 Headache: Secondary | ICD-10-CM

## 2016-06-03 DIAGNOSIS — R0789 Other chest pain: Secondary | ICD-10-CM

## 2016-06-03 DIAGNOSIS — G479 Sleep disorder, unspecified: Secondary | ICD-10-CM

## 2016-06-03 DIAGNOSIS — G478 Other sleep disorders: Secondary | ICD-10-CM

## 2016-06-03 NOTE — Progress Notes (Signed)
Subjective:    Patient ID: Gina Fernandez is a 65 y.o. female.  HPI     Star Age, MD, PhD [facility] [address], [address]O. [place], [place] [address]  Dear Ulice Dash and Shawna Orleans,   I saw your patient, Gina Fernandez, upon your kind request in my neurologic clinic today for initial consultation of her sleep disorder, in particular, concern for underlying obstructive sleep apnea. The patient is unaccompanied today. As you know, Gina Fernandez is a 65 year old right-handed woman with an underlying medical history of chest pain, hyperlipidemia, hypertension, family history of premature heart disease, shortness of breath on exertion, vitamin D deficiency, and ASD, who reports snoring and lack of energy, nonrestorative sleep. Her Epworth sleepiness score is 0 out of 24, her fatigue score is 41 out of 63 today. I reviewed your office note from 05/05/2016, which you kindly included. You have seen for chest pain, in particular nocturnal angina type symptoms. Workup in your office has included an echocardiogram on 02/13/2015 which showed mild concentric left ventricular hypertrophy, normal global wall motion, normal diastolic filling pattern, calculated EF of 65%. Severe aneurysmal motion of the atrial septum, cannot exclude presence of a pH of oh/fenestrated ASD. No evidence of pulmonary hypertension. Exercise Myoview stress test from 09/17/2015 showed resting EKG with normal sinus rhythm, incomplete right bundle branch block, myocardial perfusion normal. Left ventricular EF was 63%. She reports having to take medication for sleep. She takes Halcion, typically 2 tablets at night and has done so for years. This is prescribed by her psychiatrist, Dr. Casimiro Needle. She also takes Xanax as needed, reports taking it rarely. She lives alone, is divorced for many years and has a grown son who recently got married and lives close by. She lives with 3 cats, some of them sleep on her bed  but don't bother her. She does not have any other pets. She has the TV on at night and it stays on until sometime in the middle of the night she will turn it off. She goes to bed around 10:30 but typically is not a sleep for at least an hour or sometimes half an hour, has to take Halcion every night. She has a wake time of around 5:30 or 6. 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 She is a nonsmoker and drinks alcohol in the form of wine, typically 2 classes at night. She drinks caffeine in the form of diet Coke, usually once a day. She does not endorse much in the way of restless leg symptoms but is a restless sleeper, moves a lot in her sleep and tends to bite her nails in her sleep. She  denies any nocturia but has nighttime chest pain, wakes up with a panic at times. She does not have much in the way of reflux symptoms. She snores and has been told so by friends. She has occasional morning headaches, sometimes more frequently to the point where she takes Tylenol, this is a dull, not severe headache typically. She has no obvious family history of obstructive sleep apnea but father passed away young at 39 with a heart attack and mother died at 84 from a complication of her aortic aneurysm.  Her Past Medical History Is Significant For: Past Medical History  Diagnosis Date  . Hypertension   . Osteoporosis 12/2012    T score from -3.5  . Depression   . Anxiety     Her Past Surgical History Is Significant  For: Past Surgical History  Procedure Laterality Date  . Foot surgery  2010    LEFT FOOT  . Tee without cardioversion N/A 09/25/2015    Procedure: TRANSESOPHAGEAL ECHOCARDIOGRAM (TEE);  Surgeon: Adrian Prows, MD;  Location: [facility] ENDOSCOPY;  Service: Cardiovascular;  Laterality: N/A;  . Nose surgery      Her Family History Is Significant For: Family History  Problem Relation Age of Onset  . Hypertension Mother   . Aneurysm Mother   . Hypertension Father   . Heart disease Father   .  Breast cancer Maternal Aunt 60  . Cancer Sister     Bladder  . Heart disease Brother   . Heart failure Sister     Her Social History Is Significant For: Social History   Social History  . Marital Status: Divorced    Spouse Name: N/A  . Number of Children: 1  . Years of Education: N/A   Occupational History  . Retired    Social History Main Topics  . Smoking status: Never Smoker   . Smokeless tobacco: Never Used  . Alcohol Use: 3.0 oz/week    5 Standard drinks or equivalent per week  . Drug Use: No  . Sexual Activity: Not Currently     Comment: 1st intercourse 65 yo-Fewer than 5 partners   Other Topics Concern  . None   Social History Narrative   1 diet coke a day     Her Allergies Are:  Allergies  Allergen Reactions  . Penicillins Swelling  :   Her Current Medications Are:  Outpatient Encounter Prescriptions as of 06/03/2016  Medication Sig  . ALPRAZolam (XANAX) 0.5 MG tablet Take 0.5 mg by mouth as needed. For anxiety  . carvedilol (COREG) 6.25 MG tablet   . Cholecalciferol (VITAMIN D PO) Take by mouth.  . pravastatin (PRAVACHOL) 20 MG tablet Take 20 mg by mouth daily.  Marland Kitchen telmisartan (MICARDIS) 40 MG tablet Take 40 mg by mouth daily.  . triazolam (HALCION) 0.25 MG tablet Take 0.25-0.75 mg by mouth at bedtime as needed.  . venlafaxine XR (EFFEXOR-XR) 150 MG 24 hr capsule Take 300 mg by mouth daily.   No facility-administered encounter medications on file as of 06/03/2016.  :  Review of Systems:  Out of a complete 14 point review of systems, all are reviewed and negative with the exception of these symptoms as listed below:   Review of Systems  Constitutional: Positive for fatigue.  Neurological:       Patient reports that she has trouble falling and staying asleep, snoring, wakes up choking, wakes up feeling tired, daytime tiredness, denies taking naps.   Psychiatric/Behavioral:       Decreased energy  Epworth Sleepiness Scale 0= would never doze 1=  slight chance of dozing 2= moderate chance of dozing 3= high chance of dozing  Sitting and reading:0 Watching TV:0 Sitting inactive in a public place (ex. Theater or meeting):0 As a passenger in a car for an hour without a break:0 Lying down to rest in the afternoon:0 Sitting and talking to someone:0 Sitting quietly after lunch (no alcohol):0 In a car, while stopped in traffic:0 Total:0  Objective:  Neurologic Exam  Physical Exam Physical Examination:   Filed Vitals:   06/03/16 1009  BP: 138/82  Pulse: 72  Resp: 16   General Examination: The patient is a very pleasant 65 y.o. female in no acute distress. She appears well-developed and well-nourished and well groomed.   HEENT: Normocephalic, atraumatic, pupils are  equal, round and reactive to light and accommodation. Funduscopic exam is normal with sharp disc margins noted. Extraocular tracking is good without limitation to gaze excursion or nystagmus noted. Normal smooth pursuit is noted. Hearing is grossly intact. Tympanic membranes are clear bilaterally. Face is symmetric with normal facial animation and normal facial sensation. Speech is clear with no dysarthria noted. There is no hypophonia. There is no lip, neck/head, jaw or voice tremor. Neck is supple with full range of passive and active motion. There are no carotid bruits on auscultation. Oropharynx exam reveals: mild mouth dryness, adequate dental hygiene and mild airway crowding, due to smaller airway. Mallampati is class I. Tongue protrudes centrally and palate elevates symmetrically. Tonsils are absent. Neck size is 14 inches. She has a Mild overbite.   Chest: Clear to auscultation without wheezing, rhonchi or crackles noted.  Heart: S1+S2+0, regular and normal without murmurs, rubs or gallops noted.   Abdomen: Soft, non-tender and non-distended with normal bowel sounds appreciated on auscultation.  Extremities: There is no pitting edema in the distal lower  extremities bilaterally. Pedal pulses are intact.  Skin: Warm and dry without trophic changes noted. There are no varicose veins.  Musculoskeletal: exam reveals no obvious joint deformities, tenderness or joint swelling or erythema.   Neurologically:  Mental status: The patient is awake, alert and oriented in all 4 spheres. Her immediate and remote memory, attention, language skills and fund of knowledge are appropriate. There is no evidence of aphasia, agnosia, apraxia or anomia. Speech is clear with normal prosody and enunciation. Thought process is linear. Mood is normal and affect is normal.  Cranial nerves II - XII are as described above under HEENT exam. In addition: shoulder shrug is normal with equal shoulder height noted. Motor exam: Normal bulk, strength and tone is noted. There is no drift, tremor or rebound. Romberg is negative. Reflexes are 2+ throughout. Babinski: Toes are flexor bilaterally. Fine motor skills and coordination: intact with normal finger taps, normal hand movements, normal rapid alternating patting, normal foot taps and normal foot agility.  Cerebellar testing: No dysmetria or intention tremor on finger to nose testing. Heel to shin is unremarkable bilaterally. There is no truncal or gait ataxia.  Sensory exam: intact to light touch, pinprick, vibration, temperature sense in the upper and lower extremities.  Gait, station and balance: She stands easily. No veering to one side is noted. No leaning to one side is noted. Posture is age-appropriate and stance is narrow based. Gait shows normal stride length and normal pace. No problems turning are noted. She turns en bloc. Tandem walk is somewhat challenging for her but doable.   Assessment and Plan:   In summary, Gina Fernandez is a very pleasant 65 y.o.-year old female  with an underlying medical history of chest pain, hyperlipidemia, hypertension, family history of premature heart disease, shortness of breath on  exertion, vitamin D deficiency, and ASD, whose history and physical exam are concerning for obstructive sleep apnea (OSA). I had a long chat with the patient about my findings and the diagnosis of OSA, its prognosis and treatment options. We talked about medical treatments, surgical interventions and non-pharmacological approaches. I explained in particular the risks and ramifications of untreated moderate to severe OSA, especially with respect to developing cardiovascular disease down the Road, including congestive heart failure, difficult to treat hypertension, cardiac arrhythmias, or stroke. Even type 2 diabetes has, in part, been linked to untreated OSA. Symptoms of untreated OSA include daytime sleepiness, memory problems,  mood irritability and mood disorder such as depression and anxiety, lack of energy, as well as recurrent headaches, especially morning headaches. We talked about trying to maintain a healthy lifestyle in general, as well as the importance of weight control. I encouraged the patient to eat healthy, exercise daily and keep well hydrated, to keep a scheduled bedtime and wake time routine, to not skip any meals and eat healthy snacks in between meals. I advised the patient not to drive when feeling sleepy. I recommended the following at this time: sleep study with potential positive airway pressure titration. (We will score hypopneas at 4% and split the sleep study into diagnostic and treatment portion, if the estimated. 2 hour AHI is >20/h).   I explained the sleep test procedure to the patient and answered all her questions. She particularly asked if she could take her nighttime medication which she is able to do for the sleep study. Her main concern is to look for an organic underlying sleep disorder, see if we can figure out why she is such a restless sleeper, and why she may wake up with chest pain at night. She also reports tooth grinding, which we can determine the a sleep study as  well. She does not endorse any overt or telltale symptoms of restless leg syndrome. I will see her back after her sleep studies completed.   Thank you very much for allowing me to participate in the care of this nice patient. If I can be of any further assistance to you please do not hesitate to call me at [phone].  Sincerely,   Star Age, MD, PhD

## 2016-06-03 NOTE — Patient Instructions (Signed)

## 2017-01-12 ENCOUNTER — Other Ambulatory Visit: Payer: Self-pay | Admitting: Family Medicine

## 2017-01-12 DIAGNOSIS — S32010A Wedge compression fracture of first lumbar vertebra, initial encounter for closed fracture: Secondary | ICD-10-CM

## 2017-01-15 ENCOUNTER — Other Ambulatory Visit: Payer: Self-pay | Admitting: Family Medicine

## 2017-01-15 ENCOUNTER — Ambulatory Visit
Admission: RE | Admit: 2017-01-15 | Discharge: 2017-01-15 | Disposition: A | Discharge status: Home / Self Care | Payer: Medicare Other | Source: Ambulatory Visit | Attending: Family Medicine | Admitting: Family Medicine

## 2017-01-15 ENCOUNTER — Other Ambulatory Visit: Payer: Medicare Other

## 2017-01-15 DIAGNOSIS — S32020A Wedge compression fracture of second lumbar vertebra, initial encounter for closed fracture: Secondary | ICD-10-CM

## 2017-03-13 ENCOUNTER — Encounter: Payer: Self-pay | Admitting: Gynecology

## 2017-03-13 ENCOUNTER — Ambulatory Visit (INDEPENDENT_AMBULATORY_CARE_PROVIDER_SITE_OTHER): Payer: Medicare Other | Admitting: Gynecology

## 2017-03-13 VITALS — BP 140/82 | Ht 61.0 in | Wt 132.0 lb

## 2017-03-13 DIAGNOSIS — N952 Postmenopausal atrophic vaginitis: Secondary | ICD-10-CM | POA: Diagnosis not present

## 2017-03-13 DIAGNOSIS — Z01411 Encounter for gynecological examination (general) (routine) with abnormal findings: Secondary | ICD-10-CM | POA: Diagnosis not present

## 2017-03-13 DIAGNOSIS — R109 Unspecified abdominal pain: Secondary | ICD-10-CM

## 2017-03-13 DIAGNOSIS — M810 Age-related osteoporosis without current pathological fracture: Secondary | ICD-10-CM | POA: Diagnosis not present

## 2017-03-13 LAB — URINALYSIS W MICROSCOPIC + REFLEX CULTURE
BILIRUBIN URINE: NEGATIVE
CASTS: NONE SEEN [LPF]
CRYSTALS: NONE SEEN [HPF]
GLUCOSE, UA: NEGATIVE
HGB URINE DIPSTICK: NEGATIVE
Ketones, ur: NEGATIVE
Nitrite: NEGATIVE
PROTEIN: NEGATIVE
RBC / HPF: NONE SEEN RBC/HPF (ref <=2)
Specific Gravity, Urine: 1.015 (ref 1.001–1.035)
Yeast: NONE SEEN [HPF]
pH: 6 (ref 5.0–8.0)

## 2017-03-13 NOTE — Patient Instructions (Signed)
Follow-up for the ultrasound as scheduled.  Follow-up for the bone density as scheduled. 

## 2017-03-13 NOTE — Addendum Note (Signed)
Addended by: Thurnell Garbe A on: 03/13/2017 12:27 PM   Modules accepted: Orders

## 2017-03-13 NOTE — Progress Notes (Signed)
    KARRINE KLUTTZ [birth year] [id number]        66 y.o.  G1P1 for annual exam.  Also complaining of lower abdominal discomfort bilaterally. Cramping aching. Started months ago. Had seen her GI gave her literature on irritable bowel. Not having nausea vomiting diarrhea constipation. Does note urinary frequency and some urgency. No low back pain dysuria fever or chills. No vaginal discharge or odor irritation. Patient relates recently following off her bed and fracturing her lower lumbar spine. Does have a history of osteoporosis as outlined below.  Past medical history,surgical history, problem list, medications, allergies, family history and social history were all reviewed and documented as reviewed in the EPIC chart.  ROS:  Performed with pertinent positives and negatives included in the history, assessment and plan.   Additional significant findings :  None   Exam: Copywriter, advertising Vitals:   03/13/17 1059  BP: 140/82  Weight: 132 lb (59.9 kg)  Height: 5\' 1"  (1.549 m)   Body mass index is 24.94 kg/m.  General appearance:  Normal affect, orientation and appearance. Skin: Grossly normal HEENT: Without gross lesions.  No cervical or supraclavicular adenopathy. Thyroid normal.  Lungs:  Clear without wheezing, rales or rhonchi Cardiac: RR, without RMG Abdominal:  Soft, nontender, without masses, guarding, rebound, organomegaly or hernia Breasts:  Examined lying and sitting without masses, retractions, discharge or axillary adenopathy. Pelvic:  Ext, BUS, Vagina: With atrophic changes  Cervix: With atrophic changes  Uterus: Anteverted, normal size, shape and contour, midline and mobile nontender   Adnexa: Without masses or tenderness    Anus and perineum: Normal   Rectovaginal: Normal sphincter tone without palpated masses or tenderness.    Assessment/Plan:  66 y.o. G1P1 female for annual exam.   1. Postmenopausal/atrophic genital changes. No significant hot flushes, night sweats,  vaginal dryness or any vaginal bleeding. Continue monitor report any issues or bleeding. 2. Osteoporosis. Recently fell off of her bed and reports fracturing her lower lumbar spine. DEXA 2014 T score -3.5. Had been on bisphosphonates for 6-7 years discontinuing 2012. Reviewed a low impact fracture diagnostic of osteoporosis. Certainly her T score confirms this. We'll go ahead and get another baseline DEXA now. Discussed need for treatment. Possibilities to reviewed to include bisphosphonates, Prolia and other alternatives. I think she would be a good candidate for Prolia and we will further discuss this after her DEXA baseline she knows the importance of follow up. 3. Lower abdominal pain. Had been discussed irritable bowel. Her symptoms seem to persist. Does have some urinary symptoms. Urinalysis overall unremarkable. Will follow up with culture for completeness. Discussed possibilities to include interstitial cystitis. Will start with ultrasound to rule out GYN pathology such as ovarian cystic changes. Her exam is normal today. Ultrasound normal will consider referral to urology if culture is also negative. 4. Pap smear 2015. Pap smear done today. No history of significant abnormal Pap smears previously. 5. Sigmoidoscopy reported 12 years ago. Is actively seeing her gastroenterologist and will continue to follow up with them in reference to need for colon screening. 6. Mammography 2017. Due now and I reminded patient to call and schedule. SBE monthly reviewed. 7. Health maintenance. No routine lab work done as patient does this elsewhere. Follow up for her bone density and ultrasound. Follow up in one year for annual exam.   Anastasio Auerbach MD, 11:32 AM 03/13/2017

## 2017-03-15 LAB — URINE CULTURE: ORGANISM ID, BACTERIA: NO GROWTH

## 2017-03-16 LAB — PAP IG W/ RFLX HPV ASCU

## 2017-03-30 ENCOUNTER — Ambulatory Visit (INDEPENDENT_AMBULATORY_CARE_PROVIDER_SITE_OTHER): Payer: Medicare Other

## 2017-03-30 ENCOUNTER — Ambulatory Visit (INDEPENDENT_AMBULATORY_CARE_PROVIDER_SITE_OTHER): Payer: Medicare Other | Admitting: Gynecology

## 2017-03-30 VITALS — BP 136/84

## 2017-03-30 DIAGNOSIS — N952 Postmenopausal atrophic vaginitis: Secondary | ICD-10-CM

## 2017-03-30 DIAGNOSIS — R102 Pelvic and perineal pain: Secondary | ICD-10-CM

## 2017-03-30 DIAGNOSIS — R109 Unspecified abdominal pain: Secondary | ICD-10-CM | POA: Diagnosis not present

## 2017-03-30 NOTE — Patient Instructions (Signed)
Call if you want to start vaginal estrogen.

## 2017-03-30 NOTE — Progress Notes (Signed)
    Gina Fernandez [birth year] [id number]        66 y.o.  G1P1 presents for ultrasound. Was having pelvic discomfort and we wanted to rule out GYN pathology. Also wanted to discuss vaginal dryness.  Past medical history,surgical history, problem list, medications, allergies, family history and social history were all reviewed and documented in the EPIC chart.  Directed ROS with pertinent positives and negatives documented in the history of present illness/assessment and plan.  Exam: Vitals:   03/30/17 1041  BP: 136/84   General appearance:  Normal  Ultrasound transvaginal and transabdominal shows uterus normal size and echotexture. Endometrial echo 1.6 mm. Right and left ovary atrophic. Cul-de-sac negative.   Assessment/Plan:  66 y.o. G1P1 with normal ultrasound. This point patient does not want to pursue any further evaluation. Will monitor her symptoms and follow up if they persist or change. Is having some issues with vaginal dryness. Discussed options to include OTC products versus vaginal estrogen supplementation. Risks versus benefits to include absorption with systemic effects such as stroke heart attack DVT breast and endometrial stimulation discussed. At this point patient is not interested in using vaginal estrogen but will call if she changes her mind. Otherwise she will follow up when she is due for her annual exam, sooner if any issues. She does have her bone density scheduled tomorrow.    Anastasio Auerbach MD, 10:50 AM 03/30/2017

## 2017-03-31 ENCOUNTER — Telehealth: Payer: Self-pay | Admitting: Gynecology

## 2017-03-31 ENCOUNTER — Ambulatory Visit (INDEPENDENT_AMBULATORY_CARE_PROVIDER_SITE_OTHER): Payer: Medicare Other

## 2017-03-31 ENCOUNTER — Encounter: Payer: Self-pay | Admitting: Gynecology

## 2017-03-31 DIAGNOSIS — M810 Age-related osteoporosis without current pathological fracture: Secondary | ICD-10-CM | POA: Diagnosis not present

## 2017-03-31 NOTE — Telephone Encounter (Signed)
Tell patient most recent bone density shows osteoporosis. Recommend office visit to discuss treatment options.

## 2017-03-31 NOTE — Telephone Encounter (Signed)
Pt informed, will have front desk call back to schedule.

## 2017-04-10 ENCOUNTER — Ambulatory Visit: Payer: Medicare Other | Admitting: Gynecology

## 2017-04-15 ENCOUNTER — Encounter: Payer: Self-pay | Admitting: Gynecology

## 2017-04-15 ENCOUNTER — Telehealth: Payer: Self-pay | Admitting: Gynecology

## 2017-04-15 ENCOUNTER — Ambulatory Visit (INDEPENDENT_AMBULATORY_CARE_PROVIDER_SITE_OTHER): Payer: Medicare Other | Admitting: Gynecology

## 2017-04-15 VITALS — BP 124/80

## 2017-04-15 DIAGNOSIS — M810 Age-related osteoporosis without current pathological fracture: Secondary | ICD-10-CM | POA: Diagnosis not present

## 2017-04-15 LAB — COMPREHENSIVE METABOLIC PANEL
ALBUMIN: 4.5 g/dL (ref 3.6–5.1)
ALK PHOS: 89 U/L (ref 33–130)
ALT: 18 U/L (ref 6–29)
AST: 20 U/L (ref 10–35)
BILIRUBIN TOTAL: 0.3 mg/dL (ref 0.2–1.2)
BUN: 17 mg/dL (ref 7–25)
CALCIUM: 10.1 mg/dL (ref 8.6–10.4)
CO2: 24 mmol/L (ref 20–31)
Chloride: 105 mmol/L (ref 98–110)
Creat: 0.74 mg/dL (ref 0.50–0.99)
Glucose, Bld: 93 mg/dL (ref 65–99)
Potassium: 4.6 mmol/L (ref 3.5–5.3)
Sodium: 138 mmol/L (ref 135–146)
Total Protein: 6.9 g/dL (ref 6.1–8.1)

## 2017-04-15 LAB — CBC WITH DIFFERENTIAL/PLATELET
BASOS PCT: 1 %
Basophils Absolute: 40 cells/uL (ref 0–200)
EOS ABS: 40 {cells}/uL (ref 15–500)
Eosinophils Relative: 1 %
HCT: 43.7 % (ref 35.0–45.0)
Hemoglobin: 14.1 g/dL (ref 11.7–15.5)
LYMPHS PCT: 42 %
Lymphs Abs: 1680 cells/uL (ref 850–3900)
MCH: 31.5 pg (ref 27.0–33.0)
MCHC: 32.3 g/dL (ref 32.0–36.0)
MCV: 97.5 fL (ref 80.0–100.0)
MONO ABS: 360 {cells}/uL (ref 200–950)
MONOS PCT: 9 %
MPV: 9.6 fL (ref 7.5–12.5)
NEUTROS ABS: 1880 {cells}/uL (ref 1500–7800)
Neutrophils Relative %: 47 %
PLATELETS: 257 10*3/uL (ref 140–400)
RBC: 4.48 MIL/uL (ref 3.80–5.10)
RDW: 13.1 % (ref 11.0–15.0)
WBC: 4 10*3/uL (ref 3.8–10.8)

## 2017-04-15 LAB — TSH: TSH: 0.64 m[IU]/L

## 2017-04-15 NOTE — Telephone Encounter (Signed)
Start patient on Prolia. Diagnosis osteoporosis T score -3.7 prior Fosamax use   Note from Dr Phineas Real

## 2017-04-15 NOTE — Progress Notes (Signed)
    Gina Fernandez 1951-06-19 [id number]        66 y.o.  G1P1 patient presents to discuss her most recent bone density showing T score -3.7. Previous DEXA T score -3.5 2014. History of recent fall with fracture. History of bisphosphonate use for 6-7 years discontinuing 2012.  Past medical history,surgical history, problem list, medications, allergies, family history and social history were all reviewed and documented in the EPIC chart.  Directed ROS with pertinent positives and negatives documented in the history of present illness/assessment and plan.  Exam: Vitals:   04/15/17 1407  BP: 124/80   General appearance:  Normal   Assessment/Plan:  66 y.o. G1P1 with history of osteoporosis with fracture. T score -3.7. Recommended baseline labs to include TSH PTH comprehensive metabolic panel and vitamin D level. Recommended patient consider treatment with treatment options reviewed. I reviewed the risks versus benefits of various options and she ultimately agrees to Prolia every 6 months. Risks of osteonecrosis of the jaw, atypical fractures, rashes and increased infection risk all reviewed. We'll go ahead and initiate this and follow up on her above lab work.  Greater than 50% of my time was spent in direct face to face counseling and coordination of care with the patient.     Anastasio Auerbach MD, 2:42 PM 04/15/2017

## 2017-04-15 NOTE — Patient Instructions (Signed)
Office will call you to arrange for Prolia.

## 2017-04-16 ENCOUNTER — Other Ambulatory Visit: Payer: Self-pay | Admitting: Gynecology

## 2017-04-16 DIAGNOSIS — E559 Vitamin D deficiency, unspecified: Secondary | ICD-10-CM

## 2017-04-16 LAB — PARATHYROID HORMONE, INTACT (NO CA): PTH: 38 pg/mL (ref 14–64)

## 2017-04-16 LAB — VITAMIN D 25 HYDROXY (VIT D DEFICIENCY, FRACTURES): VIT D 25 HYDROXY: 25 ng/mL — AB (ref 30–100)

## 2017-04-29 NOTE — Telephone Encounter (Signed)
Calcium 10.0  04/15/17  Complete exam 03/13/17  TF.  Ins benefit  No PA, NO Deduc (met $183), Prolia co pay $50 , with/without OV additional co pay $35. Approx total $85.  Vm left to call office

## 2017-05-04 ENCOUNTER — Other Ambulatory Visit: Payer: Self-pay | Admitting: Orthopedic Surgery

## 2017-05-04 DIAGNOSIS — M545 Low back pain: Secondary | ICD-10-CM

## 2017-05-04 NOTE — Telephone Encounter (Signed)
PC from pt did not leave a OK for detailed message to be left . I again asked her to call and leave instructions for a message due to [facility] does not give permission.

## 2017-05-05 ENCOUNTER — Encounter: Payer: Self-pay | Admitting: Gynecology

## 2017-05-05 NOTE — Telephone Encounter (Addendum)
Pt okd on Vm to leave a detailed message on her mobile phone. I explained her co pay information and asked that she leave me a date and time to come in for the Prolia  injection. Pt will come in on 05/07/17 at  82 for Nurse only . She will also update her DPR

## 2017-05-07 ENCOUNTER — Ambulatory Visit (INDEPENDENT_AMBULATORY_CARE_PROVIDER_SITE_OTHER): Payer: Medicare Other | Admitting: Gynecology

## 2017-05-07 DIAGNOSIS — M810 Age-related osteoporosis without current pathological fracture: Secondary | ICD-10-CM | POA: Diagnosis not present

## 2017-05-07 MED ORDER — DENOSUMAB 60 MG/ML ~~LOC~~ SOLN
60.0000 mg | Freq: Once | SUBCUTANEOUS | Status: AC
  Administered 2017-05-07: 60 mg via SUBCUTANEOUS

## 2017-05-07 NOTE — Telephone Encounter (Signed)
Injection given  Prolia , next injection due 11/08/17, pt did not update DPR

## 2017-05-11 ENCOUNTER — Ambulatory Visit
Admission: RE | Admit: 2017-05-11 | Discharge: 2017-05-11 | Disposition: A | Discharge status: Home / Self Care | Payer: Medicare Other | Source: Ambulatory Visit | Attending: Orthopedic Surgery | Admitting: Orthopedic Surgery

## 2017-05-11 ENCOUNTER — Other Ambulatory Visit: Payer: Self-pay | Admitting: Gynecology

## 2017-05-11 DIAGNOSIS — M545 Low back pain: Secondary | ICD-10-CM

## 2017-05-11 DIAGNOSIS — Z1231 Encounter for screening mammogram for malignant neoplasm of breast: Secondary | ICD-10-CM

## 2017-05-12 ENCOUNTER — Ambulatory Visit
Admission: RE | Admit: 2017-05-12 | Discharge: 2017-05-12 | Disposition: A | Discharge status: Home / Self Care | Payer: Medicare Other | Source: Ambulatory Visit | Attending: Gynecology | Admitting: Gynecology

## 2017-05-12 DIAGNOSIS — Z1231 Encounter for screening mammogram for malignant neoplasm of breast: Secondary | ICD-10-CM

## 2017-05-18 ENCOUNTER — Other Ambulatory Visit: Payer: Medicare Other

## 2017-05-19 ENCOUNTER — Other Ambulatory Visit: Payer: Self-pay | Admitting: Orthopedic Surgery

## 2017-05-26 ENCOUNTER — Encounter (HOSPITAL_COMMUNITY)
Admission: RE | Admit: 2017-05-26 | Discharge: 2017-05-26 | Disposition: A | Discharge status: Home / Self Care | Payer: Medicare Other | Source: Ambulatory Visit | Attending: Orthopedic Surgery | Admitting: Orthopedic Surgery

## 2017-05-26 ENCOUNTER — Ambulatory Visit (HOSPITAL_COMMUNITY)
Admission: RE | Admit: 2017-05-26 | Discharge: 2017-05-26 | Disposition: A | Discharge status: Home / Self Care | Payer: Medicare Other | Source: Ambulatory Visit | Attending: Orthopedic Surgery | Admitting: Orthopedic Surgery

## 2017-05-26 ENCOUNTER — Encounter (HOSPITAL_COMMUNITY): Payer: Self-pay

## 2017-05-26 DIAGNOSIS — R9431 Abnormal electrocardiogram [ECG] [EKG]: Secondary | ICD-10-CM | POA: Diagnosis not present

## 2017-05-26 DIAGNOSIS — Z01812 Encounter for preprocedural laboratory examination: Secondary | ICD-10-CM | POA: Insufficient documentation

## 2017-05-26 DIAGNOSIS — Z01818 Encounter for other preprocedural examination: Secondary | ICD-10-CM | POA: Diagnosis present

## 2017-05-26 DIAGNOSIS — Z01810 Encounter for preprocedural cardiovascular examination: Secondary | ICD-10-CM | POA: Diagnosis not present

## 2017-05-26 HISTORY — DX: Headache: R51

## 2017-05-26 HISTORY — DX: Headache, unspecified: R51.9

## 2017-05-26 LAB — COMPREHENSIVE METABOLIC PANEL
ALT: 26 U/L (ref 14–54)
AST: 36 U/L (ref 15–41)
Albumin: 4.5 g/dL (ref 3.5–5.0)
Alkaline Phosphatase: 87 U/L (ref 38–126)
Anion gap: 12 (ref 5–15)
BILIRUBIN TOTAL: 0.5 mg/dL (ref 0.3–1.2)
BUN: 20 mg/dL (ref 6–20)
CALCIUM: 9 mg/dL (ref 8.9–10.3)
CO2: 24 mmol/L (ref 22–32)
CREATININE: 0.94 mg/dL (ref 0.44–1.00)
Chloride: 103 mmol/L (ref 101–111)
GFR calc Af Amer: >60 mL/min (ref >60)
Glucose, Bld: 83 mg/dL (ref 65–99)
Potassium: 3.3 mmol/L — ABNORMAL LOW (ref 3.5–5.1)
Sodium: 139 mmol/L (ref 135–145)
TOTAL PROTEIN: 7 g/dL (ref 6.5–8.1)

## 2017-05-26 LAB — CBC WITH DIFFERENTIAL/PLATELET
BASOS ABS: 0 10*3/uL (ref 0.0–0.1)
Basophils Relative: 0 %
Eosinophils Absolute: 0 10*3/uL (ref 0.0–0.7)
Eosinophils Relative: 0 %
HEMATOCRIT: 43.2 % (ref 36.0–46.0)
Hemoglobin: 13.8 g/dL (ref 12.0–15.0)
LYMPHS ABS: 2.4 10*3/uL (ref 0.7–4.0)
LYMPHS PCT: 39 %
MCH: 31.5 pg (ref 26.0–34.0)
MCHC: 31.9 g/dL (ref 30.0–36.0)
MCV: 98.6 fL (ref 78.0–100.0)
MONO ABS: 0.5 10*3/uL (ref 0.1–1.0)
Monocytes Relative: 9 %
NEUTROS ABS: 3.1 10*3/uL (ref 1.7–7.7)
Neutrophils Relative %: 52 %
Platelets: 221 10*3/uL (ref 150–400)
RBC: 4.38 MIL/uL (ref 3.87–5.11)
RDW: 13.1 % (ref 11.5–15.5)
WBC: 6.1 10*3/uL (ref 4.0–10.5)

## 2017-05-26 LAB — URINALYSIS, ROUTINE W REFLEX MICROSCOPIC
BILIRUBIN URINE: NEGATIVE
Glucose, UA: NEGATIVE mg/dL
HGB URINE DIPSTICK: NEGATIVE
KETONES UR: 5 mg/dL — AB
Leukocytes, UA: NEGATIVE
Nitrite: NEGATIVE
PH: 7 (ref 5.0–8.0)
PROTEIN: NEGATIVE mg/dL
Specific Gravity, Urine: 1.02 (ref 1.005–1.030)

## 2017-05-26 LAB — APTT: APTT: 24 s (ref 24–36)

## 2017-05-26 LAB — SURGICAL PCR SCREEN
MRSA, PCR: NEGATIVE
Staphylococcus aureus: NEGATIVE

## 2017-05-26 LAB — PROTIME-INR
INR: 0.95
Prothrombin Time: 12.6 seconds (ref 11.4–15.2)

## 2017-05-26 NOTE — Pre-Procedure Instructions (Signed)
Gina Fernandez  05/26/2017      [facility] [address], [address] AT [facility] OF [address] [place] [address] Phone: [phone] Fax: [phone]    Your procedure is scheduled on     Thursday  05/28/17  Report to [facility] Admitting at 815 A.M.  Call this number if you have problems the morning of surgery:  [phone]   Remember:  Do not eat food or drink liquids after midnight.  Take these medicines the morning of surgery with A SIP OF WATER    acetominophen if needed, ALPRAZOLAM IF NEEDED, CARVEDILOL (COREG), VENLAFAXINE (EFFEXOR)  7 days prior to surgery STOP taking any Aspirin, Aleve, Naproxen, Ibuprofen, Motrin, Advil, Goody's, BC's, all herbal medications, fish oil, and all vitamins   Do not wear jewelry, make-up or nail polish.  Do not wear lotions, powders, or perfumes, or deoderant.  Do not shave 48 hours prior to surgery.  Men may shave face and neck.  Do not bring valuables to the hospital.  [facility] is not responsible for any belongings or valuables.  Contacts, dentures or bridgework may not be worn into surgery.  Leave your suitcase in the car.  After surgery it may be brought to your room.  For patients admitted to the hospital, discharge time will be determined by your treatment team.  Patients discharged the day of surgery will not be allowed to drive home.   Name and phone number of your driver:    Special instructions:  [place] - Preparing for Surgery  Before surgery, you can play an important role.  Because skin is not sterile, your skin needs to be as free of germs as possible.  You can reduce the number of germs on you skin by washing with CHG (chlorahexidine gluconate) soap before surgery.  CHG is an antiseptic cleaner which kills germs and bonds with the skin to continue killing germs even after washing.  Please DO NOT use if you have an allergy to CHG or antibacterial  soaps.  If your skin becomes reddened/irritated stop using the CHG and inform your nurse when you arrive at Short Stay.  Do not shave (including legs and underarms) for at least 48 hours prior to the first CHG shower.  You may shave your face.  Please follow these instructions carefully:   1.  Shower with CHG Soap the night before surgery and the                                morning of Surgery.  2.  If you choose to wash your hair, wash your hair first as usual with your       normal shampoo.  3.  After you shampoo, rinse your hair and body thoroughly to remove the                      Shampoo.  4.  Use CHG as you would any other liquid soap.  You can apply chg directly       to the skin and wash gently with scrungie or a clean washcloth.  5.  Apply the CHG Soap to your body ONLY FROM THE NECK DOWN.        Do not use on open wounds or open sores.  Avoid contact with your eyes,  ears, mouth and genitals (private parts).  Wash genitals (private parts)       with your normal soap.  6.  Wash thoroughly, paying special attention to the area where your surgery        will be performed.  7.  Thoroughly rinse your body with warm water from the neck down.  8.  DO NOT shower/wash with your normal soap after using and rinsing off       the CHG Soap.  9.  Pat yourself dry with a clean towel.            10.  Wear clean pajamas.            11.  Place clean sheets on your bed the night of your first shower and do not        sleep with pets.  Day of Surgery  Do not apply any lotions/deoderants the morning of surgery.  Please wear clean clothes to the hospital/surgery center.    Please read over the following fact sheets that you were given. MRSA Information and Surgical Site Infection Prevention

## 2017-05-27 NOTE — H&P (Signed)
PREOPERATIVE H&P  Chief Complaint: back pain  HPI: Gina Fernandez is a 66 y.o. female who presents with ongoing pain in the back beginning 5 months ago  MRI reveals a subacute L1 compression fracture  Patient has failed multiple forms of conservative care and continues to have pain (see office notes for additional details regarding the patient's full course of treatment)  Past Medical History:  Diagnosis Date  . Anxiety   . Depression   . Headache    has not had them since 30's  . Hypertension   . Osteoporosis 03/2017   T score -3.7   Past Surgical History:  Procedure Laterality Date  . FOOT SURGERY  2010   LEFT FOOT  . NOSE SURGERY    . TEE WITHOUT CARDIOVERSION N/A 09/25/2015   Procedure: TRANSESOPHAGEAL ECHOCARDIOGRAM (TEE);  Surgeon: Adrian Prows, MD;  Location: [place];  Service: Cardiovascular;  Laterality: N/A;  . TONSILLECTOMY     Social History   Social History  . Marital status: Divorced    Spouse name: N/A  . Number of children: 1  . Years of education: N/A   Occupational History  . Retired    Social History Main Topics  . Smoking status: Never Smoker  . Smokeless tobacco: Never Used  . Alcohol use 4.8 oz/week    5 Standard drinks or equivalent, 3 Glasses of wine per week  . Drug use: No  . Sexual activity: Not Currently     Comment: 1st intercourse 66 yo-Fewer than 5 partners   Other Topics Concern  . Not on file   Social History Narrative   1 diet coke a day    Family History  Problem Relation Age of Onset  . Hypertension Mother   . Aneurysm Mother   . Hypertension Father   . Heart disease Father   . Breast cancer Maternal Aunt 60  . Cancer Sister        Bladder  . Heart disease Brother   . Heart failure Sister    Allergies  Allergen Reactions  . Penicillins Swelling    Facial swelling Has patient had a PCN reaction causing immediate rash, facial/tongue/throat swelling, SOB or lightheadedness with hypotension: Yes Has  patient had a PCN reaction causing severe rash involving mucus membranes or skin necrosis: No Has patient had a PCN reaction that required hospitalization: No Has patient had a PCN reaction occurring within the last 10 years: No If all of the above answers are "NO", then may proceed with Cephalosporin use.    Prior to Admission medications   Medication Sig Start Date End Date Taking? Authorizing Provider  acetaminophen (TYLENOL) 500 MG tablet Take 1,000 mg by mouth daily.   Yes [provider]  ALPRAZolam Duanne Moron) 0.5 MG tablet Take 0.5 mg by mouth daily as needed for anxiety. For anxiety    Yes [provider]  carvedilol (COREG) 6.25 MG tablet Take 12.5 mg by mouth daily. Takes 2 tablets at same time in the morning 05/05/16  Yes [provider]  Cholecalciferol (VITAMIN D3) 2000 units TABS Take 2,000 Units by mouth daily.   Yes [provider]  methocarbamol (ROBAXIN) 500 MG tablet Take 500 mg by mouth daily as needed for muscle spasms.   Yes [provider]  telmisartan (MICARDIS) 40 MG tablet Take 40 mg by mouth daily.   Yes [provider]  triazolam (HALCION) 0.25 MG tablet Take 0.5-0.75 mg by mouth at bedtime. Depends on difficulty  sleeping if takes 2-3 tablets   Yes [provider]  venlafaxine XR (EFFEXOR-XR) 150 MG 24 hr capsule Take 300 mg by mouth daily.   Yes [provider]  denosumab (PROLIA) 60 MG/ML SOLN injection Inject 60 mg into the skin every 6 (six) months. Administer in upper arm, thigh, or abdomen    [provider]     All other systems have been reviewed and were otherwise negative with the exception of those mentioned in the HPI and as above.  Physical Exam: There were no vitals filed for this visit.  General: Alert, no acute distress Cardiovascular: No pedal edema Respiratory: No cyanosis, no use of accessory musculature Skin: No lesions in the area of chief complaint Neurologic:  Sensation intact distally Psychiatric: Patient is competent for consent with normal mood and affect Lymphatic: No axillary or cervical lymphadenopathy  MUSCULOSKELETAL: + TTP mid back  Assessment/Plan: Lumbar 1 compression fracture Plan for Procedure(s): LUMBAR 1 KYPHOPLASTY   Sinclair Ship, MD 05/27/2017 11:34 AM

## 2017-05-28 ENCOUNTER — Ambulatory Visit (HOSPITAL_COMMUNITY): Payer: Medicare Other | Admitting: Certified Registered"

## 2017-05-28 ENCOUNTER — Ambulatory Visit (HOSPITAL_COMMUNITY)
Admission: RE | Admit: 2017-05-28 | Discharge: 2017-05-28 | Disposition: A | Discharge status: Home / Self Care | Payer: Medicare Other | Source: Ambulatory Visit | Attending: Orthopedic Surgery | Admitting: Orthopedic Surgery

## 2017-05-28 ENCOUNTER — Ambulatory Visit (HOSPITAL_COMMUNITY): Payer: Medicare Other

## 2017-05-28 ENCOUNTER — Encounter (HOSPITAL_COMMUNITY)
Admission: RE | Disposition: A | Discharge status: Home / Self Care | Payer: Self-pay | Source: Ambulatory Visit | Attending: Orthopedic Surgery

## 2017-05-28 DIAGNOSIS — F419 Anxiety disorder, unspecified: Secondary | ICD-10-CM | POA: Diagnosis not present

## 2017-05-28 DIAGNOSIS — Z419 Encounter for procedure for purposes other than remedying health state, unspecified: Secondary | ICD-10-CM

## 2017-05-28 DIAGNOSIS — F329 Major depressive disorder, single episode, unspecified: Secondary | ICD-10-CM | POA: Insufficient documentation

## 2017-05-28 DIAGNOSIS — Z880 Allergy status to penicillin: Secondary | ICD-10-CM | POA: Insufficient documentation

## 2017-05-28 DIAGNOSIS — S32018A Other fracture of first lumbar vertebra, initial encounter for closed fracture: Secondary | ICD-10-CM | POA: Diagnosis present

## 2017-05-28 DIAGNOSIS — R51 Headache: Secondary | ICD-10-CM | POA: Insufficient documentation

## 2017-05-28 DIAGNOSIS — S32010A Wedge compression fracture of first lumbar vertebra, initial encounter for closed fracture: Secondary | ICD-10-CM | POA: Insufficient documentation

## 2017-05-28 DIAGNOSIS — M810 Age-related osteoporosis without current pathological fracture: Secondary | ICD-10-CM | POA: Diagnosis not present

## 2017-05-28 DIAGNOSIS — I10 Essential (primary) hypertension: Secondary | ICD-10-CM | POA: Insufficient documentation

## 2017-05-28 DIAGNOSIS — W06XXXA Fall from bed, initial encounter: Secondary | ICD-10-CM | POA: Diagnosis not present

## 2017-05-28 DIAGNOSIS — Z79899 Other long term (current) drug therapy: Secondary | ICD-10-CM | POA: Insufficient documentation

## 2017-05-28 HISTORY — PX: KYPHOPLASTY: SHX5884

## 2017-05-28 SURGERY — KYPHOPLASTY
Anesthesia: General

## 2017-05-28 MED ORDER — BACITRACIN ZINC 500 UNIT/GM EX OINT
TOPICAL_OINTMENT | CUTANEOUS | Status: AC
  Filled 2017-05-28: qty 28.35

## 2017-05-28 MED ORDER — LACTATED RINGERS IV SOLN
INTRAVENOUS | Status: DC
  Administered 2017-05-28: 50 mL/h via INTRAVENOUS
  Administered 2017-05-28: 11:00:00 via INTRAVENOUS

## 2017-05-28 MED ORDER — ONDANSETRON HCL 4 MG/2ML IJ SOLN
INTRAMUSCULAR | Status: AC
  Filled 2017-05-28: qty 2

## 2017-05-28 MED ORDER — SCOPOLAMINE 1 MG/3DAYS TD PT72
MEDICATED_PATCH | TRANSDERMAL | Status: AC
  Filled 2017-05-28: qty 1

## 2017-05-28 MED ORDER — PROPOFOL 10 MG/ML IV BOLUS
INTRAVENOUS | Status: DC | PRN
  Administered 2017-05-28: 170 mg via INTRAVENOUS

## 2017-05-28 MED ORDER — HYDROMORPHONE HCL 1 MG/ML IJ SOLN
0.2500 mg | INTRAMUSCULAR | Status: DC | PRN
  Administered 2017-05-28: 0.25 mg via INTRAVENOUS
  Administered 2017-05-28: 0.5 mg via INTRAVENOUS

## 2017-05-28 MED ORDER — POVIDONE-IODINE 7.5 % EX SOLN: Freq: Once | CUTANEOUS | Status: DC

## 2017-05-28 MED ORDER — PHENYLEPHRINE HCL 10 MG/ML IJ SOLN
INTRAMUSCULAR | Status: DC | PRN
  Administered 2017-05-28: 50 ug/min via INTRAVENOUS

## 2017-05-28 MED ORDER — SUGAMMADEX SODIUM 200 MG/2ML IV SOLN
INTRAVENOUS | Status: AC
  Filled 2017-05-28: qty 2

## 2017-05-28 MED ORDER — FENTANYL CITRATE (PF) 100 MCG/2ML IJ SOLN
INTRAMUSCULAR | Status: AC
  Filled 2017-05-28: qty 2

## 2017-05-28 MED ORDER — BUPIVACAINE HCL (PF) 0.25 % IJ SOLN
INTRAMUSCULAR | Status: AC
  Filled 2017-05-28: qty 30

## 2017-05-28 MED ORDER — PROPOFOL 10 MG/ML IV BOLUS
INTRAVENOUS | Status: AC
  Filled 2017-05-28: qty 20

## 2017-05-28 MED ORDER — BUPIVACAINE-EPINEPHRINE (PF) 0.25% -1:200000 IJ SOLN
INTRAMUSCULAR | Status: AC
  Filled 2017-05-28: qty 30

## 2017-05-28 MED ORDER — SUCCINYLCHOLINE CHLORIDE 200 MG/10ML IV SOSY
PREFILLED_SYRINGE | INTRAVENOUS | Status: AC
  Filled 2017-05-28: qty 10

## 2017-05-28 MED ORDER — IOPAMIDOL (ISOVUE-300) INJECTION 61%
INTRAVENOUS | Status: AC
  Filled 2017-05-28: qty 50

## 2017-05-28 MED ORDER — MIDAZOLAM HCL 5 MG/5ML IJ SOLN
INTRAMUSCULAR | Status: DC | PRN
  Administered 2017-05-28: 2 mg via INTRAVENOUS

## 2017-05-28 MED ORDER — SCOPOLAMINE 1 MG/3DAYS TD PT72
MEDICATED_PATCH | TRANSDERMAL | Status: DC | PRN
  Administered 2017-05-28: 1 via TRANSDERMAL

## 2017-05-28 MED ORDER — DEXAMETHASONE SODIUM PHOSPHATE 10 MG/ML IJ SOLN
INTRAMUSCULAR | Status: DC | PRN
  Administered 2017-05-28: 8 mg via INTRAVENOUS

## 2017-05-28 MED ORDER — PHENYLEPHRINE HCL 10 MG/ML IJ SOLN
INTRAMUSCULAR | Status: DC | PRN
  Administered 2017-05-28 (×2): 80 ug via INTRAVENOUS

## 2017-05-28 MED ORDER — LIDOCAINE 2% (20 MG/ML) 5 ML SYRINGE
INTRAMUSCULAR | Status: AC
  Filled 2017-05-28: qty 5

## 2017-05-28 MED ORDER — OXYCODONE HCL 5 MG/5ML PO SOLN: 5.0000 mg | Freq: Once | ORAL | Status: DC | PRN

## 2017-05-28 MED ORDER — PROMETHAZINE HCL 25 MG/ML IJ SOLN: 6.2500 mg | INTRAMUSCULAR | Status: DC | PRN

## 2017-05-28 MED ORDER — ACETAMINOPHEN 10 MG/ML IV SOLN
INTRAVENOUS | Status: DC | PRN
  Administered 2017-05-28: 1000 mg via INTRAVENOUS

## 2017-05-28 MED ORDER — 0.9 % SODIUM CHLORIDE (POUR BTL) OPTIME
TOPICAL | Status: DC | PRN
  Administered 2017-05-28: 1000 mL

## 2017-05-28 MED ORDER — SUGAMMADEX SODIUM 200 MG/2ML IV SOLN
INTRAVENOUS | Status: DC | PRN
  Administered 2017-05-28: 122 mg via INTRAVENOUS

## 2017-05-28 MED ORDER — BACITRACIN 500 UNIT/GM EX OINT
TOPICAL_OINTMENT | CUTANEOUS | Status: DC | PRN
  Administered 2017-05-28: 1 via TOPICAL

## 2017-05-28 MED ORDER — MIDAZOLAM HCL 2 MG/2ML IJ SOLN
INTRAMUSCULAR | Status: AC
  Filled 2017-05-28: qty 2

## 2017-05-28 MED ORDER — ACETAMINOPHEN 10 MG/ML IV SOLN
INTRAVENOUS | Status: AC
  Filled 2017-05-28: qty 100

## 2017-05-28 MED ORDER — OXYCODONE HCL 5 MG PO TABS: 5.0000 mg | ORAL_TABLET | Freq: Once | ORAL | Status: DC | PRN

## 2017-05-28 MED ORDER — HYDROMORPHONE HCL 1 MG/ML IJ SOLN
INTRAMUSCULAR | Status: AC
  Filled 2017-05-28: qty 0.5

## 2017-05-28 MED ORDER — ROCURONIUM BROMIDE 100 MG/10ML IV SOLN
INTRAVENOUS | Status: DC | PRN
  Administered 2017-05-28: 30 mg via INTRAVENOUS

## 2017-05-28 MED ORDER — FENTANYL CITRATE (PF) 100 MCG/2ML IJ SOLN
50.0000 ug | Freq: Once | INTRAMUSCULAR | Status: AC
  Administered 2017-05-28: 50 ug via INTRAVENOUS

## 2017-05-28 MED ORDER — LIDOCAINE HCL (CARDIAC) 20 MG/ML IV SOLN
INTRAVENOUS | Status: DC | PRN
  Administered 2017-05-28: 60 mg via INTRAVENOUS

## 2017-05-28 MED ORDER — IOPAMIDOL (ISOVUE-300) INJECTION 61%
INTRAVENOUS | Status: DC | PRN
  Administered 2017-05-28: 50 mL via INTRAVENOUS

## 2017-05-28 MED ORDER — FENTANYL CITRATE (PF) 250 MCG/5ML IJ SOLN
INTRAMUSCULAR | Status: AC
  Filled 2017-05-28: qty 5

## 2017-05-28 MED ORDER — MEPERIDINE HCL 25 MG/ML IJ SOLN: 6.2500 mg | INTRAMUSCULAR | Status: DC | PRN

## 2017-05-28 MED ORDER — ROCURONIUM BROMIDE 10 MG/ML (PF) SYRINGE
PREFILLED_SYRINGE | INTRAVENOUS | Status: AC
  Filled 2017-05-28: qty 5

## 2017-05-28 MED ORDER — PHENYLEPHRINE 40 MCG/ML (10ML) SYRINGE FOR IV PUSH (FOR BLOOD PRESSURE SUPPORT)
PREFILLED_SYRINGE | INTRAVENOUS | Status: AC
  Filled 2017-05-28: qty 10

## 2017-05-28 MED ORDER — ONDANSETRON HCL 4 MG/2ML IJ SOLN
INTRAMUSCULAR | Status: DC | PRN
  Administered 2017-05-28: 4 mg via INTRAVENOUS

## 2017-05-28 MED ORDER — EPINEPHRINE PF 1 MG/ML IJ SOLN
INTRAMUSCULAR | Status: AC
  Filled 2017-05-28: qty 1

## 2017-05-28 MED ORDER — VANCOMYCIN HCL IN DEXTROSE 1-5 GM/200ML-% IV SOLN
1000.0000 mg | INTRAVENOUS | Status: AC
  Administered 2017-05-28: 1000 mg via INTRAVENOUS
  Filled 2017-05-28: qty 200

## 2017-05-28 MED ORDER — DEXAMETHASONE SODIUM PHOSPHATE 10 MG/ML IJ SOLN
INTRAMUSCULAR | Status: AC
  Filled 2017-05-28: qty 1

## 2017-05-28 MED ORDER — FENTANYL CITRATE (PF) 100 MCG/2ML IJ SOLN
INTRAMUSCULAR | Status: DC | PRN
  Administered 2017-05-28 (×2): 50 ug via INTRAVENOUS
  Administered 2017-05-28: 100 ug via INTRAVENOUS

## 2017-05-28 MED ORDER — SUCCINYLCHOLINE CHLORIDE 20 MG/ML IJ SOLN
INTRAMUSCULAR | Status: DC | PRN
  Administered 2017-05-28: 120 mg via INTRAVENOUS

## 2017-05-28 MED ORDER — BUPIVACAINE LIPOSOME 1.3 % IJ SUSP
20.0000 mL | INTRAMUSCULAR | Status: DC
Start: 2017-05-28 — End: 2017-05-28
  Filled 2017-05-28: qty 20

## 2017-05-28 SURGICAL SUPPLY — 45 items
BLADE SURG 15 STRL LF DISP TIS (BLADE) ×1 IMPLANT
BLADE SURG 15 STRL SS (BLADE) ×2
CEMENT KYPHON C01A KIT/MIXER (Cement) ×1 IMPLANT
COVER MAYO STAND STRL (DRAPES) ×1 IMPLANT
COVER SURGICAL LIGHT HANDLE (MISCELLANEOUS) ×1 IMPLANT
CURETTE EXPRESS SZ2 7MM (INSTRUMENTS) IMPLANT
CURRETTE EXPRESS SZ2 7MM (INSTRUMENTS) ×2
DRAPE C-ARM 42X72 X-RAY (DRAPES) ×3 IMPLANT
DRAPE HALF SHEET 40X57 (DRAPES) IMPLANT
DRAPE INCISE IOBAN 66X45 STRL (DRAPES) ×2 IMPLANT
DRAPE LAPAROTOMY T 102X78X121 (DRAPES) ×2 IMPLANT
DRAPE SURG 17X23 STRL (DRAPES) ×8 IMPLANT
DRSG MEPILEX BORDER 4X4 (GAUZE/BANDAGES/DRESSINGS) ×1 IMPLANT
DURAPREP 26ML APPLICATOR (WOUND CARE) ×2 IMPLANT
GAUZE SPONGE 2X2 8PLY STRL LF (GAUZE/BANDAGES/DRESSINGS) IMPLANT
GAUZE SPONGE 4X4 12PLY STRL (GAUZE/BANDAGES/DRESSINGS) ×1 IMPLANT
GAUZE SPONGE 4X4 16PLY XRAY LF (GAUZE/BANDAGES/DRESSINGS) ×2 IMPLANT
GLOVE BIO SURGEON STRL SZ7 (GLOVE) ×2 IMPLANT
GLOVE BIO SURGEON STRL SZ8 (GLOVE) ×2 IMPLANT
GLOVE BIOGEL PI IND STRL 7.0 (GLOVE) ×1 IMPLANT
GLOVE BIOGEL PI IND STRL 8 (GLOVE) ×1 IMPLANT
GLOVE BIOGEL PI INDICATOR 7.0 (GLOVE) ×1
GLOVE BIOGEL PI INDICATOR 8 (GLOVE) ×1
GOWN STRL REUS W/ TWL LRG LVL3 (GOWN DISPOSABLE) ×2 IMPLANT
GOWN STRL REUS W/ TWL XL LVL3 (GOWN DISPOSABLE) ×1 IMPLANT
GOWN STRL REUS W/TWL LRG LVL3 (GOWN DISPOSABLE) ×4
GOWN STRL REUS W/TWL XL LVL3 (GOWN DISPOSABLE) ×2
KIT BASIN OR (CUSTOM PROCEDURE TRAY) ×2 IMPLANT
KIT ROOM TURNOVER OR (KITS) ×2 IMPLANT
NDL HYPO 25X1 1.5 SAFETY (NEEDLE) IMPLANT
NDL SPNL 18GX3.5 QUINCKE PK (NEEDLE) ×2 IMPLANT
NEEDLE 22X1 1/2 (OR ONLY) (NEEDLE) IMPLANT
NEEDLE HYPO 25X1 1.5 SAFETY (NEEDLE) IMPLANT
NEEDLE SPNL 18GX3.5 QUINCKE PK (NEEDLE) ×4 IMPLANT
NS IRRIG 1000ML POUR BTL (IV SOLUTION) ×2 IMPLANT
PACK UNIVERSAL I (CUSTOM PROCEDURE TRAY) ×2 IMPLANT
PAD ARMBOARD 7.5X6 YLW CONV (MISCELLANEOUS) ×4 IMPLANT
POSITIONER HEAD PRONE TRACH (MISCELLANEOUS) ×2 IMPLANT
SPONGE GAUZE 2X2 STER 10/PKG (GAUZE/BANDAGES/DRESSINGS) ×1
SUT MNCRL AB 4-0 PS2 18 (SUTURE) ×2 IMPLANT
SYR BULB IRRIGATION 50ML (SYRINGE) ×2 IMPLANT
SYR CONTROL 10ML LL (SYRINGE) ×2 IMPLANT
TOWEL OR 17X24 6PK STRL BLUE (TOWEL DISPOSABLE) ×1 IMPLANT
TOWEL OR 17X26 10 PK STRL BLUE (TOWEL DISPOSABLE) ×2 IMPLANT
TRAY KYPHOPAK 15/3 ONESTEP 1ST (MISCELLANEOUS) ×1 IMPLANT

## 2017-05-28 NOTE — Transfer of Care (Signed)
Immediate Anesthesia Transfer of Care Note  Patient: Gina Fernandez  Procedure(s) Performed: Procedure(s) with comments: LUMBAR 1 KYPHOPLASTY (N/A) - LUMBAR 1 KYPHOPLASTY; REQUEST 1 HOUR AND FLIP ROOM  Patient Location: PACU  Anesthesia Type:General  Level of Consciousness: awake, alert  and confused  Airway & Oxygen Therapy: Patient connected to face mask oxygen  Post-op Assessment: Post -op Vital signs reviewed and stable  Post vital signs: stable  Last Vitals:  Vitals:   05/28/17 0853  BP: 137/90  Pulse: 72  Resp: 20  Temp: 36.7 C    Last Pain:  Vitals:   05/28/17 0950  TempSrc:   PainSc: 6       Patients Stated Pain Goal: 3 (23/30/07 6226)  Complications: No apparent anesthesia complications

## 2017-05-28 NOTE — Op Note (Signed)
[name], DOME NO.:  [id number]  MEDICAL RECORD NO.:  [id number]  LOCATION:                                 FACILITY:  PHYSICIAN:  Phylliss Bob, MD           DATE OF BIRTH:  DATE OF PROCEDURE:  05/28/2017                              OPERATIVE REPORT   PREOPERATIVE DIAGNOSIS:  L1 compression fracture.  POSTOPERATIVE DIAGNOSIS:  L1 compression fracture.  PROCEDURE:  L1 kyphoplasty.  SURGEON:  Phylliss Bob, MD.  ASSISTANT:  None.  ANESTHESIA:  General endotracheal anesthesia.  COMPLICATIONS:  None.  DISPOSITION:  Stable.  ESTIMATED BLOOD LOSS:  Minimal.  INDICATIONS FOR SURGERY:  Briefly, Mr. Divirgilio is a pleasant 66 year old female, who did sustain a fall on December 24, 2016, when she fell out of bed.  She was diagnosed as having an L1 compression fracture.  She was treated conservatively, but did continue to have ongoing pain. Almost 5 months after her injury, she did continue to have ongoing pain despite appropriate conservative treatment measures.  An MRI was obtained and was notable for edema throughout the L1 vertebral body with a wedging deformity.  There was very minimal edema noted in the T12 vertebral body, but there was no deformity identified.  We, therefore did discuss proceeding with a L1 kyphoplasty procedure.  The patient was fully aware of the risks and limitations of surgery and did elect to proceed.  OPERATIVE DETAILS:  On May 28, 2017, the patient was brought to the surgery and general endotracheal anesthesia was administered.  The patient was placed prone on a well-padded flat Jackson bed.  Gels were placed under the patient's chest and hips.  Antibiotics were given.  The back was prepped and draped and a time-out procedure was performed.  I then made 2 very small stab incisions at the upper and lateral aspects of the L1 pedicles.  Jamshidi's were advanced across the L1 pedicles into the L1 vertebral bodies.  I then  drilled and curetted through the cannulas.  Of note, the bone was noted to be very sclerotic, which was consistent with the fact that her fracture was 71-month-old.  I then inserted kyphoplasty balloons and inserted each balloon with about approximately 2 mL of contrast.  No significant motion of the superior endplate was noted.  I then introduced approximately a total of 5 mL of cement, half to the right side and half through the left into the L1 vertebral body.  Excellent interdigitation of the cement was noted. There was no extravasation of cement into the spinal canal or laterally or anteriorly.  The cement was then allowed to harden.  The cannulas were then removed.  The wounds were then irrigated and closed with 4-0 Monocryl.  Bacitracin followed by sterile dressing was then applied.  All instrument counts were correct at the termination of the procedure.     Phylliss Bob, MD   ______________________________ Phylliss Bob, MD    MD/MEDQ  D:  05/28/2017  T:  05/28/2017  Job:  [id number]

## 2017-05-28 NOTE — Anesthesia Postprocedure Evaluation (Signed)
Anesthesia Post Note  Patient: Gina Fernandez  Procedure(s) Performed: Procedure(s) (LRB): LUMBAR 1 KYPHOPLASTY (N/A)     Patient location during evaluation: PACU Anesthesia Type: General Level of consciousness: awake and alert Pain management: pain level controlled Vital Signs Assessment: post-procedure vital signs reviewed and stable Respiratory status: spontaneous breathing, nonlabored ventilation and respiratory function stable Cardiovascular status: blood pressure returned to baseline and stable Postop Assessment: no signs of nausea or vomiting Anesthetic complications: no    Last Vitals:  Vitals:   05/28/17 1330 05/28/17 1337  BP:  123/83  Pulse: 76 72  Resp: 19 18  Temp:      Last Pain:  Vitals:   05/28/17 1345  TempSrc:   PainSc: [place]

## 2017-05-28 NOTE — Anesthesia Procedure Notes (Signed)
Procedure Name: Intubation Date/Time: 05/28/2017 11:57 AM Performed by: Lavell Luster Pre-anesthesia Checklist: Patient identified, Emergency Drugs available, Suction available, Patient being monitored and Timeout performed Patient Re-evaluated:Patient Re-evaluated prior to inductionOxygen Delivery Method: Circle system utilized Preoxygenation: Pre-oxygenation with 100% oxygen Intubation Type: IV induction Ventilation: Mask ventilation without difficulty Laryngoscope Size: Mac and 3 Grade View: Grade II Tube type: Oral Tube size: 7.0 mm Number of attempts: 1 Airway Equipment and Method: Stylet Placement Confirmation: ETT inserted through vocal cords under direct vision,  positive ETCO2 and breath sounds checked- equal and bilateral Secured at: 22 cm Tube secured with: Tape Dental Injury: Teeth and Oropharynx as per pre-operative assessment

## 2017-05-28 NOTE — Anesthesia Preprocedure Evaluation (Addendum)
Anesthesia Evaluation  Patient identified by MRN, date of birth, ID band Patient awake    Reviewed: Allergy & Precautions, NPO status , Patient's Chart, lab work & pertinent test results, reviewed documented beta blocker date and time   Airway Mallampati: II  TM Distance: >3 FB Neck ROM: Full    Dental no notable dental hx. (+) Teeth Intact, Dental Advisory Given   Pulmonary neg pulmonary ROS,    Pulmonary exam normal breath sounds clear to auscultation       Cardiovascular hypertension, Pt. on home beta blockers and Pt. on medications negative cardio ROS Normal cardiovascular exam Rhythm:Regular Rate:Normal     Neuro/Psych  Headaches, Anxiety Depression negative neurological ROS  negative psych ROS   GI/Hepatic negative GI ROS, Neg liver ROS, (+)     substance abuse  alcohol use,   Endo/Other  negative endocrine ROS  Renal/GU negative Renal ROS  negative genitourinary   Musculoskeletal negative musculoskeletal ROS (+)   Abdominal   Peds negative pediatric ROS (+)  Hematology negative hematology ROS (+)   Anesthesia Other Findings   Reproductive/Obstetrics negative OB ROS                            Anesthesia Physical Anesthesia Plan  ASA: II  Anesthesia Plan: General   Post-op Pain Management:    Induction: Intravenous  Airway Management Planned: Oral ETT  Additional Equipment:   Intra-op Plan:   Post-operative Plan: Extubation in OR  Informed Consent: I have reviewed the patients History and Physical, chart, labs and discussed the procedure including the risks, benefits and alternatives for the proposed anesthesia with the patient or authorized representative who has indicated his/her understanding and acceptance.   Dental advisory given  Plan Discussed with: CRNA  Anesthesia Plan Comments:         Anesthesia Quick Evaluation

## 2017-06-02 ENCOUNTER — Encounter (HOSPITAL_COMMUNITY): Payer: Self-pay | Admitting: Orthopedic Surgery

## 2017-08-07 ENCOUNTER — Other Ambulatory Visit: Payer: Medicare Other

## 2017-08-07 DIAGNOSIS — E559 Vitamin D deficiency, unspecified: Secondary | ICD-10-CM

## 2017-08-08 LAB — VITAMIN D 25 HYDROXY (VIT D DEFICIENCY, FRACTURES): Vit D, 25-Hydroxy: 27 ng/mL — ABNORMAL LOW (ref 30–100)

## 2017-08-17 ENCOUNTER — Other Ambulatory Visit: Payer: Self-pay | Admitting: *Deleted

## 2017-08-17 DIAGNOSIS — E559 Vitamin D deficiency, unspecified: Secondary | ICD-10-CM

## 2017-08-17 MED ORDER — VITAMIN D (ERGOCALCIFEROL) 1.25 MG (50000 UNIT) PO CAPS: ORAL_CAPSULE | ORAL | 0 refills | Status: DC

## 2017-10-13 ENCOUNTER — Telehealth: Payer: Self-pay | Admitting: Gynecology

## 2017-11-13 NOTE — Telephone Encounter (Signed)
Complete 02/2017, Calcium 10.1 04/18 , No deductible  Met , No PA, Secondary ins  $35 copay with/without OV  OOPM $3300  (1249.32) met, Prolia subject to $50 co pay , Total approx cost $85. LM to call and schedule injection.  Nurse only visit

## 2017-11-17 NOTE — Telephone Encounter (Signed)
pc left vm to call schedule prolia

## 2017-11-25 NOTE — Telephone Encounter (Signed)
Appointment 2 30 11/26/17  Nurse only

## 2017-11-26 ENCOUNTER — Ambulatory Visit (INDEPENDENT_AMBULATORY_CARE_PROVIDER_SITE_OTHER): Payer: Medicare Other | Admitting: Gynecology

## 2017-11-26 DIAGNOSIS — M810 Age-related osteoporosis without current pathological fracture: Secondary | ICD-10-CM

## 2017-11-26 MED ORDER — DENOSUMAB 60 MG/ML ~~LOC~~ SOLN
60.0000 mg | Freq: Once | SUBCUTANEOUS | Status: AC
  Administered 2017-11-26: 60 mg via SUBCUTANEOUS

## 2017-11-30 NOTE — Telephone Encounter (Signed)
prolia given 11/26/17  Next injection due 05/27/18

## 2018-03-22 ENCOUNTER — Telehealth: Payer: Self-pay | Admitting: *Deleted

## 2018-03-22 NOTE — Telephone Encounter (Addendum)
Deductible Amount Met  OOP MAX $3300 ($86MET)  Annual exam Appt 03/29/18  Calcium 10.0      Date 01/21/18    Upcoming dental procedures  NO  Prior Authorization needed NO  Pt estimated Cost $85   PROLIA  APPT SCHEDULED 06/07/18 @11  AM  Coverage Details: weather a office visit is billed or not the patient will be responsible for a $35 co pay.  Prolia is subject to a $50 co pay

## 2018-03-29 ENCOUNTER — Encounter: Payer: Medicare Other | Admitting: Gynecology

## 2018-03-31 ENCOUNTER — Encounter: Payer: Medicare Other | Admitting: Gynecology

## 2018-06-07 ENCOUNTER — Ambulatory Visit: Payer: Medicare Other | Admitting: Gynecology

## 2018-06-07 DIAGNOSIS — M810 Age-related osteoporosis without current pathological fracture: Secondary | ICD-10-CM

## 2018-06-07 MED ORDER — DENOSUMAB 60 MG/ML ~~LOC~~ SOSY
60.0000 mg | PREFILLED_SYRINGE | Freq: Once | SUBCUTANEOUS | Status: AC
  Administered 2018-06-07: 60 mg via SUBCUTANEOUS

## 2018-06-10 ENCOUNTER — Encounter: Payer: Self-pay | Admitting: Gynecology

## 2018-06-10 NOTE — Telephone Encounter (Signed)
PROLIA GIVEN 06/07/18 NEXT INJECTION 12/08/18

## 2018-07-06 ENCOUNTER — Ambulatory Visit
Admission: RE | Admit: 2018-07-06 | Discharge: 2018-07-06 | Disposition: A | Discharge status: Home / Self Care | Payer: Medicare Other | Source: Ambulatory Visit | Attending: Family Medicine | Admitting: Family Medicine

## 2018-07-06 ENCOUNTER — Other Ambulatory Visit: Payer: Self-pay | Admitting: Family Medicine

## 2018-07-06 DIAGNOSIS — R109 Unspecified abdominal pain: Secondary | ICD-10-CM

## 2018-07-09 ENCOUNTER — Encounter: Payer: Self-pay | Admitting: Gynecology

## 2018-07-19 ENCOUNTER — Encounter: Payer: Medicare Other | Admitting: Gynecology

## 2018-07-29 ENCOUNTER — Other Ambulatory Visit: Payer: Self-pay | Admitting: Gynecology

## 2018-07-29 DIAGNOSIS — Z1231 Encounter for screening mammogram for malignant neoplasm of breast: Secondary | ICD-10-CM

## 2018-08-23 ENCOUNTER — Ambulatory Visit: Payer: Medicare Other

## 2018-08-23 ENCOUNTER — Ambulatory Visit
Admission: RE | Admit: 2018-08-23 | Discharge: 2018-08-23 | Disposition: A | Discharge status: Home / Self Care | Payer: Medicare Other | Source: Ambulatory Visit | Attending: Gynecology | Admitting: Gynecology

## 2018-08-23 DIAGNOSIS — Z1231 Encounter for screening mammogram for malignant neoplasm of breast: Secondary | ICD-10-CM

## 2018-11-18 ENCOUNTER — Encounter: Payer: Self-pay | Admitting: Gynecology

## 2018-11-18 ENCOUNTER — Ambulatory Visit: Payer: Medicare Other | Admitting: Gynecology

## 2018-11-18 VITALS — BP 118/76 | Ht 61.0 in | Wt 138.0 lb

## 2018-11-18 DIAGNOSIS — N952 Postmenopausal atrophic vaginitis: Secondary | ICD-10-CM | POA: Diagnosis not present

## 2018-11-18 DIAGNOSIS — M810 Age-related osteoporosis without current pathological fracture: Secondary | ICD-10-CM | POA: Diagnosis not present

## 2018-11-18 DIAGNOSIS — Z01419 Encounter for gynecological examination (general) (routine) without abnormal findings: Secondary | ICD-10-CM

## 2018-11-18 NOTE — Progress Notes (Signed)
    Gina Fernandez 02-25-1951 [id number]        67 y.o.  G1P1 for annual gynecologic exam.  Without gynecologic complaints  Past medical history,surgical history, problem list, medications, allergies, family history and social history were all reviewed and documented as reviewed in the EPIC chart.  ROS:  Performed with pertinent positives and negatives included in the history, assessment and plan.   Additional significant findings : None   Exam: Caryn Bee assistant Vitals:   11/18/18 1138  BP: 118/76  Weight: 138 lb (62.6 kg)  Height: 5\' 1"  (1.549 m)   Body mass index is 26.07 kg/m.  General appearance:  Normal affect, orientation and appearance. Skin: Grossly normal HEENT: Without gross lesions.  No cervical or supraclavicular adenopathy. Thyroid normal.  Lungs:  Clear without wheezing, rales or rhonchi Cardiac: RR, without RMG Abdominal:  Soft, nontender, without masses, guarding, rebound, organomegaly or hernia Breasts:  Examined lying and sitting without masses, retractions, discharge or axillary adenopathy.  Small classic skin tag below right breast on anterior chest wall stable over years observation Pelvic:  Ext, BUS, Vagina: With atrophic changes  Cervix: With atrophic changes  Uterus: Axial to anteverted, normal size, shape and contour, midline and mobile nontender   Adnexa: Without masses or tenderness    Anus and perineum: Normal   Rectovaginal: Normal sphincter tone without palpated masses or tenderness.    Assessment/Plan:  67 y.o. G1P1 female for annual gynecologic exam.   1. Postmenopausal/atrophic genital changes.  No significant menopausal symptoms or any vaginal bleeding. 2. Osteoporosis.  DEXA 03/2017 T score -3.7.  History of fall with fracture.  Bisphosphate for approximately 6 to 7 years discontinuing 2012.  Started on Prolia last year.  Doing well with this.  Will continue on Prolia and repeat her DEXA this coming year.  Patient will schedule in  follow-up for this. 3. Mammography 07/2018.  Continue with annual mammography when due.  Breast exam normal today.  Classic small skin tag below her right breast anterior chest wall stable over years observation. 4. Pap smear 2018.  No Pap smear done today.  No history of abnormal Pap smears previously. 5. Sigmoidoscopy years ago.  Will follow-up with gastroenterology and their screening recommendations. 6. Health maintenance.  No routine lab work done as patient does this elsewhere.  Follow-up for bone density as scheduled.  Follow-up in 1 year for annual exam.   Anastasio Auerbach MD, 12:10 PM 11/18/2018

## 2018-11-18 NOTE — Patient Instructions (Signed)
Follow-up for bone density as scheduled.  Follow-up in 1 year for annual exam. 

## 2018-12-03 ENCOUNTER — Telehealth: Payer: Self-pay | Admitting: *Deleted

## 2018-12-03 NOTE — Telephone Encounter (Addendum)
Deductible Amount Met  OOP MAX  $3300 ($484mt)  Annual exam 11/18/18 TF  Calcium 9.8     Date 07/05/18  ESadie Haber([facility]  Upcoming dental procedures   Prior Authorization needed   Pt estimated Cost $85  Appt 12/13/18 at 11:00      Coverage Details: $ 50 one dose and 35 admin fee

## 2018-12-08 ENCOUNTER — Ambulatory Visit: Payer: Medicare Other

## 2018-12-13 ENCOUNTER — Ambulatory Visit (INDEPENDENT_AMBULATORY_CARE_PROVIDER_SITE_OTHER): Payer: Medicare Other | Admitting: Gynecology

## 2018-12-13 DIAGNOSIS — M810 Age-related osteoporosis without current pathological fracture: Secondary | ICD-10-CM | POA: Diagnosis not present

## 2018-12-13 MED ORDER — DENOSUMAB 60 MG/ML ~~LOC~~ SOSY
60.0000 mg | PREFILLED_SYRINGE | Freq: Once | SUBCUTANEOUS | Status: AC
  Administered 2018-12-13: 60 mg via SUBCUTANEOUS

## 2018-12-14 ENCOUNTER — Ambulatory Visit: Payer: Medicare Other | Admitting: Gynecology

## 2018-12-25 ENCOUNTER — Emergency Department (HOSPITAL_COMMUNITY)
Admission: EM | Admit: 2018-12-25 | Discharge: 2018-12-25 | Disposition: A | Discharge status: Home / Self Care | Payer: Medicare Other | Attending: Emergency Medicine | Admitting: Emergency Medicine

## 2018-12-25 ENCOUNTER — Other Ambulatory Visit: Payer: Self-pay

## 2018-12-25 ENCOUNTER — Emergency Department (HOSPITAL_COMMUNITY): Payer: Medicare Other

## 2018-12-25 ENCOUNTER — Encounter (HOSPITAL_COMMUNITY): Payer: Self-pay

## 2018-12-25 DIAGNOSIS — R1084 Generalized abdominal pain: Secondary | ICD-10-CM | POA: Insufficient documentation

## 2018-12-25 DIAGNOSIS — R197 Diarrhea, unspecified: Secondary | ICD-10-CM | POA: Insufficient documentation

## 2018-12-25 DIAGNOSIS — I10 Essential (primary) hypertension: Secondary | ICD-10-CM | POA: Insufficient documentation

## 2018-12-25 DIAGNOSIS — R112 Nausea with vomiting, unspecified: Secondary | ICD-10-CM | POA: Diagnosis present

## 2018-12-25 DIAGNOSIS — Z79899 Other long term (current) drug therapy: Secondary | ICD-10-CM | POA: Insufficient documentation

## 2018-12-25 LAB — CBC WITH DIFFERENTIAL/PLATELET
Abs Immature Granulocytes: 0.02 10*3/uL (ref 0.00–0.07)
Basophils Absolute: 0 10*3/uL (ref 0.0–0.1)
Basophils Relative: 0 %
Eosinophils Absolute: 0 10*3/uL (ref 0.0–0.5)
Eosinophils Relative: 0 %
HCT: 46.3 % — ABNORMAL HIGH (ref 36.0–46.0)
Hemoglobin: 14.6 g/dL (ref 12.0–15.0)
Immature Granulocytes: 0 %
Lymphocytes Relative: 2 %
Lymphs Abs: 0.2 10*3/uL — ABNORMAL LOW (ref 0.7–4.0)
MCH: 33 pg (ref 26.0–34.0)
MCHC: 31.5 g/dL (ref 30.0–36.0)
MCV: 104.8 fL — ABNORMAL HIGH (ref 80.0–100.0)
Monocytes Absolute: 0.2 10*3/uL (ref 0.1–1.0)
Monocytes Relative: 3 %
Neutro Abs: 7.8 10*3/uL — ABNORMAL HIGH (ref 1.7–7.7)
Neutrophils Relative %: 95 %
Platelets: 225 10*3/uL (ref 150–400)
RBC: 4.42 MIL/uL (ref 3.87–5.11)
RDW: 12.8 % (ref 11.5–15.5)
WBC: 8.2 10*3/uL (ref 4.0–10.5)
nRBC: 0 % (ref 0.0–0.2)

## 2018-12-25 LAB — URINALYSIS, ROUTINE W REFLEX MICROSCOPIC
Bacteria, UA: NONE SEEN
Bilirubin Urine: NEGATIVE
Glucose, UA: NEGATIVE mg/dL
Hgb urine dipstick: NEGATIVE
Ketones, ur: NEGATIVE mg/dL
Nitrite: NEGATIVE
Protein, ur: NEGATIVE mg/dL
Specific Gravity, Urine: >1.046 — ABNORMAL HIGH (ref 1.005–1.030)
pH: 6 (ref 5.0–8.0)

## 2018-12-25 LAB — COMPREHENSIVE METABOLIC PANEL
ALT: 24 U/L (ref 0–44)
AST: 23 U/L (ref 15–41)
Albumin: 4.4 g/dL (ref 3.5–5.0)
Alkaline Phosphatase: 44 U/L (ref 38–126)
Anion gap: 11 (ref 5–15)
BUN: 25 mg/dL — ABNORMAL HIGH (ref 8–23)
CO2: 23 mmol/L (ref 22–32)
Calcium: 8.8 mg/dL — ABNORMAL LOW (ref 8.9–10.3)
Chloride: 107 mmol/L (ref 98–111)
Creatinine, Ser: 0.66 mg/dL (ref 0.44–1.00)
GFR calc Af Amer: >60 mL/min (ref >60)
GFR calc non Af Amer: >60 mL/min (ref >60)
Glucose, Bld: 121 mg/dL — ABNORMAL HIGH (ref 70–99)
Potassium: 4 mmol/L (ref 3.5–5.1)
Sodium: 141 mmol/L (ref 135–145)
Total Bilirubin: 0.2 mg/dL — ABNORMAL LOW (ref 0.3–1.2)
Total Protein: 6.9 g/dL (ref 6.5–8.1)

## 2018-12-25 MED ORDER — PROMETHAZINE HCL 25 MG/ML IJ SOLN
12.5000 mg | Freq: Once | INTRAMUSCULAR | Status: AC
  Administered 2018-12-25: 12.5 mg via INTRAVENOUS
  Filled 2018-12-25: qty 1

## 2018-12-25 MED ORDER — METOCLOPRAMIDE HCL 5 MG/ML IJ SOLN
5.0000 mg | Freq: Once | INTRAMUSCULAR | Status: AC
  Administered 2018-12-25: 5 mg via INTRAVENOUS
  Filled 2018-12-25: qty 2

## 2018-12-25 MED ORDER — DIPHENHYDRAMINE HCL 50 MG/ML IJ SOLN
12.5000 mg | Freq: Once | INTRAMUSCULAR | Status: AC
Start: 2018-12-25 — End: 2018-12-25
  Administered 2018-12-25: 12.5 mg via INTRAVENOUS
  Filled 2018-12-25: qty 1

## 2018-12-25 MED ORDER — SODIUM CHLORIDE (PF) 0.9 % IJ SOLN
INTRAMUSCULAR | Status: AC
  Filled 2018-12-25: qty 50

## 2018-12-25 MED ORDER — IOPAMIDOL (ISOVUE-300) INJECTION 61%
INTRAVENOUS | Status: AC
  Filled 2018-12-25: qty 100

## 2018-12-25 MED ORDER — LACTATED RINGERS IV BOLUS
1000.0000 mL | Freq: Once | INTRAVENOUS | Status: AC
  Administered 2018-12-25: 1000 mL via INTRAVENOUS

## 2018-12-25 MED ORDER — IOPAMIDOL (ISOVUE-300) INJECTION 61%
100.0000 mL | Freq: Once | INTRAVENOUS | Status: AC | PRN
  Administered 2018-12-25: 100 mL via INTRAVENOUS

## 2018-12-25 MED ORDER — ONDANSETRON HCL 4 MG PO TABS: 4.0000 mg | ORAL_TABLET | Freq: Four times a day (QID) | ORAL | 0 refills | Status: DC

## 2018-12-25 MED ORDER — KETOROLAC TROMETHAMINE 15 MG/ML IJ SOLN
15.0000 mg | Freq: Once | INTRAMUSCULAR | Status: AC
Start: 2018-12-25 — End: 2018-12-25
  Administered 2018-12-25: 15 mg via INTRAVENOUS
  Filled 2018-12-25: qty 1

## 2018-12-25 NOTE — ED Triage Notes (Signed)
Per EMS,  Pt had some questionable eggs yesterday morning. N/V/D countinously followed apprx 1 hr after. Went to urgent care today, was then sent here. Received 4mg  of Zofran, 461ml of NS. No episodes of N/V/D with EMS. HTX of HTN, has not been able to take her medication.

## 2018-12-25 NOTE — ED Provider Notes (Signed)
[facility] DEPT Provider Note   CSN: [id number] Arrival date & time: 12/25/18  1630     History   Chief Complaint Chief Complaint  Patient presents with  . Nausea  . Emesis  . Diarrhea    HPI Gina Fernandez is a 67 y.o. female.  HPI   67 year old female with nausea/vomiting/diarrhea.  Symptom onset yesterday morning.  Persistent since then.  She states that she hurts everywhere.  Headache and diffuse myalgias.  Diffuse abdominal pain.  Subjective fevers.  No blood in her stool and emesis.  Decreased urinary output, otherwise no acute urinary complaints.  No known sick contacts.  Past Medical History:  Diagnosis Date  . Anxiety   . Depression   . Fracture of L1 vertebra ([place])   . Headache    has not had them since 30's  . Hypertension   . Osteoporosis 03/2017   T score -3.7    Patient Active Problem List   Diagnosis Date Noted  . Depression   . Hypertension   . Osteoporosis 05/28/2010    Past Surgical History:  Procedure Laterality Date  . FOOT SURGERY  2010   LEFT FOOT  . KYPHOPLASTY N/A 05/28/2017   Procedure: LUMBAR 1 KYPHOPLASTY;  Surgeon: Phylliss Bob, MD;  Location: [facility];  Service: Orthopedics;  Laterality: N/A;  LUMBAR 1 KYPHOPLASTY; REQUEST 1 HOUR AND FLIP ROOM  . NOSE SURGERY    . TEE WITHOUT CARDIOVERSION N/A 09/25/2015   Procedure: TRANSESOPHAGEAL ECHOCARDIOGRAM (TEE);  Surgeon: Adrian Prows, MD;  Location: [facility] ENDOSCOPY;  Service: Cardiovascular;  Laterality: N/A;  . TONSILLECTOMY       OB History    Gravida  1   Para  1   Term      Preterm      AB      Living  1     SAB      TAB      Ectopic      Multiple      Live Births               Home Medications    Prior to Admission medications   Medication Sig Start Date End Date Taking? Authorizing Provider  acetaminophen (TYLENOL) 500 MG tablet Take 1,000 mg by mouth daily.    [provider]  ALPRAZolam Duanne Moron) 0.5 MG tablet Take  0.5 mg by mouth daily as needed for anxiety. For anxiety     [provider]  carvedilol (COREG) 6.25 MG tablet Take 12.5 mg by mouth daily. Takes 2 tablets at same time in the morning 05/05/16   [provider]  Cholecalciferol (VITAMIN D3) 2000 units TABS Take 2,000 Units by mouth daily.    [provider]  denosumab (PROLIA) 60 MG/ML SOLN injection Inject 60 mg into the skin every 6 (six) months. Administer in upper arm, thigh, or abdomen    [provider]  telmisartan (MICARDIS) 40 MG tablet Take 40 mg by mouth daily.    [provider]  triazolam (HALCION) 0.25 MG tablet Take 0.5-0.75 mg by mouth at bedtime. Depends on difficulty sleeping if takes 2-3 tablets    [provider]  venlafaxine XR (EFFEXOR-XR) 150 MG 24 hr capsule Take 300 mg by mouth daily.    [provider]    Family History Family History  Problem Relation Age of Onset  . Hypertension Mother   . Aneurysm Mother   . Hypertension Father   . Heart  disease Father   . Breast cancer Maternal Aunt 60  . Cancer Sister        Bladder  . Heart disease Brother   . Heart failure Sister     Social History Social History   Tobacco Use  . Smoking status: Never Smoker  . Smokeless tobacco: Never Used  Substance Use Topics  . Alcohol use: Yes    Alcohol/week: 8.0 standard drinks    Types: 5 Standard drinks or equivalent, 3 Glasses of wine per week  . Drug use: No     Allergies   Penicillins   Review of Systems Review of Systems  All systems reviewed and negative, other than as noted in HPI. Physical Exam Updated Vital Signs BP 127/82   Pulse 98   Temp 98.8 F (37.1 C)   Resp 16   Ht 5\' 2"  (1.575 m)   Wt 61.2 kg   LMP 12/29/2004   SpO2 95%   BMI 24.69 kg/m   Physical Exam Vitals signs and nursing note reviewed.  Constitutional:      General: She is not in acute distress.    Appearance: She is well-developed.  HENT:     Head:  Normocephalic and atraumatic.  Eyes:     General:        Right eye: No discharge.        Left eye: No discharge.     Conjunctiva/sclera: Conjunctivae normal.  Neck:     Musculoskeletal: Neck supple.  Cardiovascular:     Rate and Rhythm: Normal rate and regular rhythm.     Heart sounds: Normal heart sounds. No murmur. No friction rub. No gallop.   Pulmonary:     Effort: Pulmonary effort is normal. No respiratory distress.     Breath sounds: Normal breath sounds.  Abdominal:     General: There is no distension.     Palpations: Abdomen is soft.     Comments: Mild diffuse abdominal tenderness without rebound or guarding.  No distention.  Musculoskeletal:        General: No tenderness.  Skin:    General: Skin is warm and dry.  Neurological:     Mental Status: She is alert.  Psychiatric:        Behavior: Behavior normal.        Thought Content: Thought content normal.      ED Treatments / Results  Labs (all labs ordered are listed, but only abnormal results are displayed) Labs Reviewed  COMPREHENSIVE METABOLIC PANEL - Abnormal; Notable for the following components:      Result Value   Glucose, Bld 121 (*)    BUN 25 (*)    Calcium 8.8 (*)    Total Bilirubin 0.2 (*)    All other components within normal limits  URINALYSIS, ROUTINE W REFLEX MICROSCOPIC - Abnormal; Notable for the following components:   Specific Gravity, Urine >1.046 (*)    Leukocytes, UA TRACE (*)    All other components within normal limits  CBC WITH DIFFERENTIAL/PLATELET - Abnormal; Notable for the following components:   HCT 46.3 (*)    MCV 104.8 (*)    Neutro Abs 7.8 (*)    Lymphs Abs 0.2 (*)    All other components within normal limits    EKG None  Radiology No results found.   Ct Abdomen Pelvis W Contrast  Result Date: 12/25/2018 CLINICAL DATA:  Generalized acute abdomen pain. EXAM: CT ABDOMEN AND PELVIS WITH CONTRAST TECHNIQUE: Multidetector CT imaging of the  abdomen and pelvis was  performed using the standard protocol following bolus administration of intravenous contrast. CONTRAST:  136mL ISOVUE-300 IOPAMIDOL (ISOVUE-300) INJECTION 61% COMPARISON:  July 06, 2018 FINDINGS: Lower chest: Mild atelectasis of posterior lung bases are noted. Hepatobiliary: No focal liver abnormality is seen. No gallstones, gallbladder wall thickening, or biliary dilatation. Pancreas: No acute abnormality.  No inflammation. Spleen: Normal in size without focal abnormality. Adrenals/Urinary Tract: Adrenal glands are unremarkable. Kidneys are normal, without renal calculi, focal lesion, or hydronephrosis. Bladder is unremarkable. Stomach/Bowel: Stomach is within normal limits. Appendix appears normal. Mild dilatation of proximal small bowel loops are noted. There are few loops of thickened wall small bowel loops in the left upper abdomen. The mid to distal small bowel loops are normal. There is diverticulosis without diverticulitis Vascular/Lymphatic: Aortic atherosclerosis. No enlarged abdominal or pelvic lymph nodes. Reproductive: Uterus and bilateral adnexa are unremarkable. Other: Minimal umbilical herniation of mesenteric fat is noted. Musculoskeletal: Prior vertebroplasty changes are identified at L1 unchanged. IMPRESSION: There are few loops of thick wall small bowel loops in the left upper abdomen with surrounding mild dilatation of small bowel loops. This is nonspecific but can be seen in infectious/inflammatory etiology. There is no evidence of small bowel obstruction. Diverticulosis of colon without diverticulitis. Electronically Signed   By: Abelardo Diesel M.D.   On: 12/25/2018 21:15   Procedures Procedures (including critical care time)  Medications Ordered in ED Medications - No data to display   Initial Impression / Assessment and Plan / ED Course  I have reviewed the triage vital signs and the nursing notes.  Pertinent labs & imaging results that were available during my care of the patient  were reviewed by me and considered in my medical decision making (see chart for details).     66 year old female with nausea, vomiting and diarrhea.  Suspect that this may be a viral gastroenteritis.  Plan symptomatic treatment.  Return precautions discussed.  Final Clinical Impressions(s) / ED Diagnoses   Final diagnoses:  Nausea vomiting and diarrhea    ED Discharge Orders    None       Virgel Manifold, MD 12/31/18 [phone]

## 2018-12-25 NOTE — ED Notes (Signed)
Bed: WA09 Expected date:  Expected time:  Means of arrival:  Comments: EMS/n.v.d

## 2018-12-31 NOTE — Telephone Encounter (Signed)
PROLIA GIVEN 12/13/18 NEXT INJECTION 06/15/2019

## 2019-03-29 ENCOUNTER — Encounter: Payer: Self-pay | Admitting: Gynecology

## 2019-06-28 ENCOUNTER — Telehealth: Payer: Self-pay

## 2019-06-28 NOTE — Telephone Encounter (Signed)
Patient called inquiring what the name of the bone drug she has been getting injections for stating her orthodontist wanted to know. I called her back and per DPR access note on file I left message in voice mail letting her know it is Prolia 60 mg.

## 2019-07-07 ENCOUNTER — Telehealth: Payer: Self-pay | Admitting: *Deleted

## 2019-07-07 DIAGNOSIS — M810 Age-related osteoporosis without current pathological fracture: Secondary | ICD-10-CM

## 2019-07-07 NOTE — Telephone Encounter (Addendum)
Deductible Amount Met    OOP MAX Amount Met  Annual exam 11/18/18 TF  Calcium 9.9            Date 07/14/2019  Upcoming dental procedures   Prior Authorization needed NO   Pt estimated Cost $85    Appt 07/15/2019 @ 11:00   Coverage Details: one dose $50,admin dee $35

## 2019-07-13 ENCOUNTER — Other Ambulatory Visit: Payer: Self-pay

## 2019-07-14 ENCOUNTER — Other Ambulatory Visit: Payer: Medicare Other

## 2019-07-14 ENCOUNTER — Ambulatory Visit (INDEPENDENT_AMBULATORY_CARE_PROVIDER_SITE_OTHER): Payer: Medicare Other

## 2019-07-14 DIAGNOSIS — Z780 Asymptomatic menopausal state: Secondary | ICD-10-CM | POA: Diagnosis not present

## 2019-07-14 DIAGNOSIS — M810 Age-related osteoporosis without current pathological fracture: Secondary | ICD-10-CM

## 2019-07-15 ENCOUNTER — Encounter: Payer: Self-pay | Admitting: Gynecology

## 2019-07-15 ENCOUNTER — Other Ambulatory Visit: Payer: Self-pay | Admitting: Gynecology

## 2019-07-15 DIAGNOSIS — M810 Age-related osteoporosis without current pathological fracture: Secondary | ICD-10-CM

## 2019-07-15 DIAGNOSIS — Z780 Asymptomatic menopausal state: Secondary | ICD-10-CM

## 2019-07-15 LAB — CALCIUM: Calcium: 9.9 mg/dL (ref 8.6–10.4)

## 2019-07-18 ENCOUNTER — Other Ambulatory Visit: Payer: Self-pay

## 2019-07-18 ENCOUNTER — Ambulatory Visit (INDEPENDENT_AMBULATORY_CARE_PROVIDER_SITE_OTHER): Payer: Medicare Other | Admitting: Gynecology

## 2019-07-18 DIAGNOSIS — M810 Age-related osteoporosis without current pathological fracture: Secondary | ICD-10-CM

## 2019-07-18 MED ORDER — DENOSUMAB 60 MG/ML ~~LOC~~ SOSY
60.0000 mg | PREFILLED_SYRINGE | Freq: Once | SUBCUTANEOUS | Status: AC
  Administered 2019-07-18: 60 mg via SUBCUTANEOUS

## 2019-10-05 ENCOUNTER — Encounter: Payer: Self-pay | Admitting: Gynecology

## 2019-10-14 NOTE — Telephone Encounter (Signed)
[place] GIVEN 07/15/2019 NEXT INJECTION 01/16/2020

## 2019-10-17 ENCOUNTER — Other Ambulatory Visit: Payer: Self-pay

## 2019-10-17 ENCOUNTER — Emergency Department (HOSPITAL_BASED_OUTPATIENT_CLINIC_OR_DEPARTMENT_OTHER): Payer: Medicare Other

## 2019-10-17 ENCOUNTER — Encounter (HOSPITAL_BASED_OUTPATIENT_CLINIC_OR_DEPARTMENT_OTHER): Payer: Self-pay | Admitting: Emergency Medicine

## 2019-10-17 ENCOUNTER — Emergency Department (HOSPITAL_BASED_OUTPATIENT_CLINIC_OR_DEPARTMENT_OTHER)
Admission: EM | Admit: 2019-10-17 | Discharge: 2019-10-17 | Discharge status: Against Medical Advice | Payer: Medicare Other | Attending: Emergency Medicine | Admitting: Emergency Medicine

## 2019-10-17 DIAGNOSIS — Z79899 Other long term (current) drug therapy: Secondary | ICD-10-CM | POA: Insufficient documentation

## 2019-10-17 DIAGNOSIS — R109 Unspecified abdominal pain: Secondary | ICD-10-CM | POA: Diagnosis not present

## 2019-10-17 DIAGNOSIS — I10 Essential (primary) hypertension: Secondary | ICD-10-CM | POA: Diagnosis not present

## 2019-10-17 DIAGNOSIS — M62838 Other muscle spasm: Secondary | ICD-10-CM | POA: Diagnosis not present

## 2019-10-17 LAB — CBC WITH DIFFERENTIAL/PLATELET
Abs Immature Granulocytes: 0.01 10*3/uL (ref 0.00–0.07)
Basophils Absolute: 0 10*3/uL (ref 0.0–0.1)
Basophils Relative: 0 %
Eosinophils Absolute: 0.1 10*3/uL (ref 0.0–0.5)
Eosinophils Relative: 1 %
HCT: 45.7 % (ref 36.0–46.0)
Hemoglobin: 14.5 g/dL (ref 12.0–15.0)
Immature Granulocytes: 0 %
Lymphocytes Relative: 27 %
Lymphs Abs: 1.6 10*3/uL (ref 0.7–4.0)
MCH: 32.2 pg (ref 26.0–34.0)
MCHC: 31.7 g/dL (ref 30.0–36.0)
MCV: 101.6 fL — ABNORMAL HIGH (ref 80.0–100.0)
Monocytes Absolute: 0.4 10*3/uL (ref 0.1–1.0)
Monocytes Relative: 8 %
Neutro Abs: 3.8 10*3/uL (ref 1.7–7.7)
Neutrophils Relative %: 64 %
Platelets: 288 10*3/uL (ref 150–400)
RBC: 4.5 MIL/uL (ref 3.87–5.11)
RDW: 12.8 % (ref 11.5–15.5)
WBC: 5.9 10*3/uL (ref 4.0–10.5)
nRBC: 0 % (ref 0.0–0.2)

## 2019-10-17 LAB — URINALYSIS, MICROSCOPIC (REFLEX): RBC / HPF: NONE SEEN RBC/hpf (ref 0–5)

## 2019-10-17 LAB — URINALYSIS, ROUTINE W REFLEX MICROSCOPIC
Bilirubin Urine: NEGATIVE
Glucose, UA: NEGATIVE mg/dL
Ketones, ur: NEGATIVE mg/dL
Nitrite: NEGATIVE
Protein, ur: NEGATIVE mg/dL
Specific Gravity, Urine: 1.015 (ref 1.005–1.030)
pH: 6.5 (ref 5.0–8.0)

## 2019-10-17 LAB — COMPREHENSIVE METABOLIC PANEL
ALT: 26 U/L (ref 0–44)
AST: 25 U/L (ref 15–41)
Albumin: 4.8 g/dL (ref 3.5–5.0)
Alkaline Phosphatase: 49 U/L (ref 38–126)
Anion gap: 13 (ref 5–15)
BUN: 14 mg/dL (ref 8–23)
CO2: 24 mmol/L (ref 22–32)
Calcium: 9.4 mg/dL (ref 8.9–10.3)
Chloride: 102 mmol/L (ref 98–111)
Creatinine, Ser: 0.63 mg/dL (ref 0.44–1.00)
GFR calc Af Amer: >60 mL/min (ref >60)
GFR calc non Af Amer: >60 mL/min (ref >60)
Glucose, Bld: 115 mg/dL — ABNORMAL HIGH (ref 70–99)
Potassium: 4.3 mmol/L (ref 3.5–5.1)
Sodium: 139 mmol/L (ref 135–145)
Total Bilirubin: 0.6 mg/dL (ref 0.3–1.2)
Total Protein: 7.8 g/dL (ref 6.5–8.1)

## 2019-10-17 LAB — LIPASE, BLOOD: Lipase: 29 U/L (ref 11–51)

## 2019-10-17 MED ORDER — ONDANSETRON HCL 4 MG/2ML IJ SOLN
4.0000 mg | Freq: Once | INTRAMUSCULAR | Status: AC
  Administered 2019-10-17: 4 mg via INTRAVENOUS
  Filled 2019-10-17: qty 2

## 2019-10-17 MED ORDER — MORPHINE SULFATE (PF) 4 MG/ML IV SOLN
4.0000 mg | Freq: Once | INTRAVENOUS | Status: AC
  Administered 2019-10-17: 4 mg via INTRAVENOUS
  Filled 2019-10-17: qty 1

## 2019-10-17 MED ORDER — PROMETHAZINE HCL 25 MG/ML IJ SOLN
12.5000 mg | Freq: Once | INTRAMUSCULAR | Status: AC
  Administered 2019-10-17: 18:00:00 12.5 mg via INTRAVENOUS

## 2019-10-17 MED ORDER — PROMETHAZINE HCL 25 MG/ML IJ SOLN
INTRAMUSCULAR | Status: AC
  Filled 2019-10-17: qty 1

## 2019-10-17 NOTE — ED Triage Notes (Signed)
Right flank "back spasms" x2 weeks.  Has gotten worse.  Pain now wrapping around to RUQ. Nausea but no vomiting.

## 2019-10-17 NOTE — ED Notes (Addendum)
Pt requested IV taken out so she could leave, did not want to wait on paperwork.

## 2019-10-17 NOTE — ED Provider Notes (Signed)
[facility] EMERGENCY DEPARTMENT Provider Note   CSN: [id number] Arrival date & time: 10/17/19  1259     History   Chief Complaint Chief Complaint  Patient presents with  . Flank Pain    HPI Gina Fernandez is a 68 y.o. female.     The history is provided by the patient and medical records. No language interpreter was used.  Flank Pain This is a new problem. The current episode started more than 1 week ago. The problem occurs constantly. The problem has been gradually worsening. Associated symptoms include abdominal pain. Pertinent negatives include no chest pain, no headaches and no shortness of breath. The symptoms are aggravated by twisting and walking. Nothing relieves the symptoms. She has tried acetaminophen for the symptoms. The treatment provided no relief.    Past Medical History:  Diagnosis Date  . Anxiety   . Depression   . Fracture of L1 vertebra ([place])   . Headache    has not had them since 30's  . Hypertension   . Osteoporosis 06/2019   T score -2.7 improved from prior DEXA on Prolia    Patient Active Problem List   Diagnosis Date Noted  . Depression   . Hypertension   . Osteoporosis 05/28/2010    Past Surgical History:  Procedure Laterality Date  . FOOT SURGERY  2010   LEFT FOOT  . KYPHOPLASTY N/A 05/28/2017   Procedure: LUMBAR 1 KYPHOPLASTY;  Surgeon: Phylliss Bob, MD;  Location: [facility];  Service: Orthopedics;  Laterality: N/A;  LUMBAR 1 KYPHOPLASTY; REQUEST 1 HOUR AND FLIP ROOM  . NOSE SURGERY    . TEE WITHOUT CARDIOVERSION N/A 09/25/2015   Procedure: TRANSESOPHAGEAL ECHOCARDIOGRAM (TEE);  Surgeon: Adrian Prows, MD;  Location: [facility] ENDOSCOPY;  Service: Cardiovascular;  Laterality: N/A;  . TONSILLECTOMY       OB History    Gravida  1   Para  1   Term      Preterm      AB      Living  1     SAB      TAB      Ectopic      Multiple      Live Births               Home Medications    Prior to Admission medications    Medication Sig Start Date End Date Taking? Authorizing Provider  acetaminophen (TYLENOL) 500 MG tablet Take 1,000 mg by mouth daily.    [provider]  ALPRAZolam Duanne Moron) 0.5 MG tablet Take 0.5 mg by mouth daily as needed for anxiety. For anxiety     [provider]  carvedilol (COREG) 6.25 MG tablet Take 12.5 mg by mouth daily. Takes 2 tablets at same time in the morning 05/05/16   [provider]  Cholecalciferol (VITAMIN D3) 2000 units TABS Take 4,000 Units by mouth daily.     [provider]  denosumab (PROLIA) 60 MG/ML SOLN injection Inject 60 mg into the skin every 6 (six) months. Administer in upper arm, thigh, or abdomen    [provider]  ondansetron (ZOFRAN) 4 MG tablet Take 1 tablet (4 mg total) by mouth every 6 (six) hours. 12/25/18   Virgel Manifold, MD  telmisartan (MICARDIS) 40 MG tablet Take 40 mg by mouth daily.    [provider]  triazolam (HALCION) 0.25 MG tablet Take 0.5-0.75 mg by mouth at bedtime. Depends on difficulty sleeping if takes 2-3 tablets  [provider]  venlafaxine XR (EFFEXOR-XR) 150 MG 24 hr capsule Take 300 mg by mouth daily.    [provider]    Family History Family History  Problem Relation Age of Onset  . Hypertension Mother   . Aneurysm Mother   . Hypertension Father   . Heart disease Father   . Breast cancer Maternal Aunt 60  . Cancer Sister        Bladder  . Heart disease Brother   . Heart failure Sister     Social History Social History   Tobacco Use  . Smoking status: Never Smoker  . Smokeless tobacco: Never Used  Substance Use Topics  . Alcohol use: Yes    Alcohol/week: 8.0 standard drinks    Types: 3 Glasses of wine, 5 Standard drinks or equivalent per week    Comment: weekends mostly  . Drug use: No     Allergies   Penicillins   Review of Systems Review of Systems  Constitutional: Negative for chills, diaphoresis, fatigue and fever.  HENT:  Negative for congestion.   Respiratory: Negative for cough, chest tightness, shortness of breath and wheezing.   Cardiovascular: Negative for chest pain, palpitations and leg swelling.  Gastrointestinal: Positive for abdominal pain and nausea. Negative for abdominal distention, constipation, diarrhea and vomiting.  Genitourinary: Positive for flank pain. Negative for dysuria and frequency.  Musculoskeletal: Positive for back pain. Negative for neck pain and neck stiffness.  Neurological: Negative for light-headedness and headaches.  Psychiatric/Behavioral: Negative for agitation.  All other systems reviewed and are negative.    Physical Exam Updated Vital Signs BP (!) 163/116 (BP Location: Left Arm)   Pulse 80   Temp 98.2 F (36.8 C) (Oral)   Resp 16   Ht 5\' 2"  (1.575 m)   Wt 59.1 kg   LMP 12/29/2004   SpO2 97%   BMI 23.85 kg/m   Physical Exam Vitals signs and nursing note reviewed.  Constitutional:      General: She is not in acute distress.    Appearance: She is well-developed. She is not ill-appearing, toxic-appearing or diaphoretic.  HENT:     Head: Normocephalic and atraumatic.     Right Ear: External ear normal.     Left Ear: External ear normal.     Nose: Nose normal. No congestion or rhinorrhea.     Mouth/Throat:     Pharynx: No oropharyngeal exudate.  Eyes:     Extraocular Movements: Extraocular movements intact.     Conjunctiva/sclera: Conjunctivae normal.     Pupils: Pupils are equal, round, and reactive to light.  Neck:     Musculoskeletal: Normal range of motion and neck supple. No muscular tenderness.  Cardiovascular:     Rate and Rhythm: Normal rate.     Pulses: Normal pulses.     Heart sounds: No murmur.  Pulmonary:     Effort: Pulmonary effort is normal. No respiratory distress.     Breath sounds: No stridor. No wheezing, rhonchi or rales.  Chest:     Chest wall: No tenderness.  Abdominal:     General: Abdomen is flat. Bowel sounds are normal.  There is no distension.     Tenderness: There is abdominal tenderness in the right upper quadrant and right lower quadrant. There is right CVA tenderness. There is no left CVA tenderness, guarding or rebound.    Musculoskeletal:     Thoracic back: She exhibits tenderness, pain and spasm.       Back:  Skin:    General: Skin is warm.     Findings: No erythema or rash.  Neurological:     General: No focal deficit present.     Mental Status: She is alert and oriented to person, place, and time.     Sensory: No sensory deficit.     Motor: No weakness or abnormal muscle tone.     Deep Tendon Reflexes: Reflexes are normal and symmetric.      ED Treatments / Results  Labs (all labs ordered are listed, but only abnormal results are displayed) Labs Reviewed  URINALYSIS, ROUTINE W REFLEX MICROSCOPIC - Abnormal; Notable for the following components:      Result Value   Hgb urine dipstick TRACE (*)    Leukocytes,Ua TRACE (*)    All other components within normal limits  CBC WITH DIFFERENTIAL/PLATELET - Abnormal; Notable for the following components:   MCV 101.6 (*)    All other components within normal limits  COMPREHENSIVE METABOLIC PANEL - Abnormal; Notable for the following components:   Glucose, Bld 115 (*)    All other components within normal limits  URINALYSIS, MICROSCOPIC (REFLEX) - Abnormal; Notable for the following components:   Bacteria, UA FEW (*)    All other components within normal limits  URINE CULTURE  LIPASE, BLOOD    EKG None  Radiology Ct Renal Stone Study  Result Date: 10/17/2019 CLINICAL DATA:  Severe right back and flank pain for 2 weeks. Nausea. EXAM: CT ABDOMEN AND PELVIS WITHOUT CONTRAST TECHNIQUE: Multidetector CT imaging of the abdomen and pelvis was performed following the standard protocol without IV contrast. COMPARISON:  12/25/2018 FINDINGS: Lower chest: No acute findings. Hepatobiliary: No mass visualized on this unenhanced exam. Gallbladder is  unremarkable. No evidence of biliary ductal dilatation. Pancreas: No mass or inflammatory process visualized on this unenhanced exam. Spleen:  Within normal limits in size. Adrenals/Urinary tract: No evidence of urolithiasis or hydronephrosis. Unremarkable unopacified urinary bladder. Stomach/Bowel: No evidence of obstruction, inflammatory process, or abnormal fluid collections. Normal appendix visualized. Diverticulosis is seen mainly involving the descending and sigmoid colon, however there is no evidence of diverticulitis. Vascular/Lymphatic: No pathologically enlarged lymph nodes identified. No evidence of abdominal aortic aneurysm. Aortic atherosclerosis. Reproductive:  No mass or other significant abnormality. Other:  None. Musculoskeletal: No suspicious bone lesions identified. Previous vertebroplasty seen at compression fracture of the L1 3vertebral body. IMPRESSION: No evidence of urolithiasis, hydronephrosis, or other acute findings. Colonic diverticulosis, without radiographic evidence of diverticulitis. Aortic Atherosclerosis (ICD10-I70.0). Electronically Signed   By: Marlaine Hind M.D.   On: 10/17/2019 15:50    Procedures Procedures (including critical care time)  Medications Ordered in ED Medications  morphine 4 MG/ML injection 4 mg (4 mg Intravenous Given 10/17/19 1556)  ondansetron (ZOFRAN) injection 4 mg (4 mg Intravenous Given 10/17/19 1554)  promethazine (PHENERGAN) injection 12.5 mg (12.5 mg Intravenous Given 10/17/19 1736)     Initial Impression / Assessment and Plan / ED Course  I have reviewed the triage vital signs and the nursing notes.  Pertinent labs & imaging results that were available during my care of the patient were reviewed by me and considered in my medical decision making (see chart for details).        VENISSA JASA is a 68 y.o. female with a past medical history significant for hypertension, depression, osteoporosis, and prior L1 vertebral fracture  status post kyphoplasty who presents with right back pain, right flank pain, and right abdominal pain.  Patient reports over  the last 2 weeks she has had the pain radiating from the back around towards the front.  She has never had this type of pain before.  She thinks she is having muscle spasms on her right lateral back but is having the pain on the abdomen.  She denies any rash or history of shingles.  She denies any history of kidney stones.  No history of gallbladder disease or appendicitis.  No history of abdominal surgeries.  She reports nausea but no vomiting developing.  She denies any urinary changes.  No constipation or diarrhea aside from her chronic bowel problems.  She denies any new trauma or injuries.  She reports pain does not feel in the middle of her back.  She describes the pain as an 8 out of 10 in severity and goes up to a 9 when she tries to move.  She has tried over-the-counter medications without relief.  On exam, patient does have right-sided CVA tenderness, right flank tenderness, and right abdominal tenderness.  No guarding or rebound tenderness.  Normal bowel sounds.  Chest and upper back nontender.  Normal breath sounds.  No murmur.  Good pulses in extremities.  Normal gait.  No focal neurologic deficits.  No rash.  Clinical I am most concerned about kidney stone versus gallbladder disease versus muscle spasm.  Lower suspicion for appendicitis given location of discomfort however after shared decision made conversation, we agreed to get labs, urine, and a CT stone study given the location of the discomfort.  The pain does not radiate straight through to the back and is coming from the back towards the front around the side.  Lower suspicion for an aortic etiology of symptoms.  Good pulses in lower extremities.  If work-up is reassuring, anticipate discharge with Flexeril and Lidoderm patch for muscle spasms however given her age and location of pain, will make sure she does not  have a large stone or atypical gallbladder problem.   Patient's work-up was reassuring.  Patient had improvement with symptoms with medications but she did get nausea from morphine.  She was given Phenergan which significant ultrasound was.  Suspect musculoskeletal spasm and pain.  Patient  will going to give prescription for muscle relaxant and Lidoderm patch however patient eloped from the emergency department for I could give the prescriptions.  Patient already was going to follow-up with PCP for further management.  She understood return precautions and eloped in goodcondition.   Final Clinical Impressions(s) / ED Diagnoses   Final diagnoses:  Flank pain  Muscle spasm    ED Discharge Orders    None     Clinical Impression: 1. Flank pain   2. Muscle spasm     Disposition: Eloped in stable condition just prior to discharge.  Condition: Good    Discharge Medication List as of 10/17/2019  8:45 PM      Follow Up: [place] [address] [phone]     [facility] [address] [id number] [place] [phone]       Tegeler, Gwenyth Allegra, MD 10/18/19 [phone]

## 2019-10-18 LAB — URINE CULTURE: Culture: <10000 — AB

## 2020-01-02 ENCOUNTER — Telehealth: Payer: Self-pay | Admitting: *Deleted

## 2020-01-02 NOTE — Telephone Encounter (Signed)
[employer] insurance verification has been sent awaiting Summary of benefits  

## 2020-01-02 NOTE — Telephone Encounter (Signed)
received a fax reply and confirmed with [employer] coverage was terminated 12/19/2019  Spoke with Pt she states she will call insurance company and call me back with current information

## 2020-01-12 NOTE — Telephone Encounter (Addendum)
Pt states she has switched insurance to [place] PPO ID [id number] GROUP # [id number]  PROVIDER # [id number]  Prol[employer] insurance verification has been sent awaiting Summary of benefits  Prior Auth processing [id number] prior auth claim #  [employer] Prior auth approved 01/18/2020 to 12/28/2020  Deductible N/A  OOP MAX  Annual exam   Calcium  9.4           Date 10/17/2019  Upcoming dental procedures   Prior Authorization needed   Pt estimated Cost $462  Appt 02/15/2020   PT IS RECEIVING COVID VACCINE WOULD LIKE TO CALL ME BACK TO SCHEDULE PROLIA INJECTION    Coverage Details: 40% ONE DOSE,40% ADMIN FEE

## 2020-01-18 ENCOUNTER — Ambulatory Visit: Payer: Medicare PPO | Attending: Internal Medicine

## 2020-01-18 DIAGNOSIS — Z23 Encounter for immunization: Secondary | ICD-10-CM | POA: Insufficient documentation

## 2020-01-18 NOTE — Progress Notes (Signed)
   Covid-19 Vaccination Clinic  Name:  Gina Fernandez    MRN: [id number] DOB: 04-30-1951  01/18/2020  [name] was observed post Covid-19 immunization for 15 minutes without incidence. She was provided with Vaccine Information Sheet and instruction to access the V-Safe system.   [name] was instructed to call 911 with any severe reactions post vaccine: Marland Kitchen Difficulty breathing  . Swelling of your face and throat  . A fast heartbeat  . A bad rash all over your body  . Dizziness and weakness    Immunizations Administered    Name Date Dose VIS Date Route   Pfizer COVID-19 Vaccine 01/18/2020  9:29 AM 0.3 mL 12/09/2019 Intramuscular   Manufacturer: [place]   Lot: [id number]   [place]: [id number]

## 2020-02-06 ENCOUNTER — Ambulatory Visit: Payer: Medicare PPO | Attending: Internal Medicine

## 2020-02-06 DIAGNOSIS — Z23 Encounter for immunization: Secondary | ICD-10-CM | POA: Insufficient documentation

## 2020-02-06 NOTE — Progress Notes (Signed)
   Covid-19 Vaccination Clinic  Name:  Gina Fernandez    MRN: [id number] DOB: 1951-03-21  02/06/2020  [name] was observed post Covid-19 immunization for 15 minutes without incidence. She was provided with Vaccine Information Sheet and instruction to access the V-Safe system.   [name] was instructed to call 911 with any severe reactions post vaccine: Marland Kitchen Difficulty breathing  . Swelling of your face and throat  . A fast heartbeat  . A bad rash all over your body  . Dizziness and weakness    Immunizations Administered    Name Date Dose VIS Date Route   Pfizer COVID-19 Vaccine 02/06/2020  1:45 PM 0.3 mL 12/09/2019 Intramuscular   Manufacturer: [place]   Lot: [id number]   [place]: [id number]

## 2020-02-13 ENCOUNTER — Ambulatory Visit: Payer: Medicare Other

## 2020-02-15 ENCOUNTER — Ambulatory Visit (INDEPENDENT_AMBULATORY_CARE_PROVIDER_SITE_OTHER): Payer: Medicare PPO | Admitting: *Deleted

## 2020-02-15 ENCOUNTER — Other Ambulatory Visit: Payer: Self-pay

## 2020-02-15 DIAGNOSIS — M810 Age-related osteoporosis without current pathological fracture: Secondary | ICD-10-CM

## 2020-02-15 MED ORDER — DENOSUMAB 60 MG/ML ~~LOC~~ SOSY
60.0000 mg | PREFILLED_SYRINGE | Freq: Once | SUBCUTANEOUS | Status: AC
  Administered 2020-02-15: 60 mg via SUBCUTANEOUS

## 2020-02-17 ENCOUNTER — Ambulatory Visit: Payer: Medicare Other

## 2020-03-23 ENCOUNTER — Other Ambulatory Visit: Payer: Self-pay

## 2020-03-26 ENCOUNTER — Encounter: Payer: Medicare Other | Admitting: Women's Health

## 2020-04-17 ENCOUNTER — Other Ambulatory Visit: Payer: Self-pay

## 2020-04-18 ENCOUNTER — Encounter: Payer: Self-pay | Admitting: Women's Health

## 2020-04-18 ENCOUNTER — Ambulatory Visit: Payer: Medicare PPO | Admitting: Women's Health

## 2020-04-18 ENCOUNTER — Other Ambulatory Visit: Payer: Self-pay

## 2020-04-18 VITALS — BP 122/80 | Ht 61.0 in | Wt 132.0 lb

## 2020-04-18 DIAGNOSIS — Z01419 Encounter for gynecological examination (general) (routine) without abnormal findings: Secondary | ICD-10-CM

## 2020-04-18 DIAGNOSIS — M810 Age-related osteoporosis without current pathological fracture: Secondary | ICD-10-CM | POA: Diagnosis not present

## 2020-04-18 DIAGNOSIS — Z780 Asymptomatic menopausal state: Secondary | ICD-10-CM | POA: Diagnosis not present

## 2020-04-18 NOTE — Patient Instructions (Addendum)
Vitamin D 2000 IUs daily Try yoga cologard shingrex  Shingles vaccine  2 series shot [facility]  Dr Raquel James, or Dr Carlean Purl  [phone]   Health Maintenance After Age 71 After age 55, you are at a higher risk for certain long-term diseases and infections as well as injuries from falls. Falls are a major cause of broken bones and head injuries in people who are older than age 62. Getting regular preventive care can help to keep you healthy and well. Preventive care includes getting regular testing and making lifestyle changes as recommended by your health care provider. Talk with your health care provider about:  Which screenings and tests you should have. A screening is a test that checks for a disease when you have no symptoms.  A diet and exercise plan that is right for you. What should I know about screenings and tests to prevent falls? Screening and testing are the best ways to find a health problem early. Early diagnosis and treatment give you the best chance of managing medical conditions that are common after age 46. Certain conditions and lifestyle choices may make you more likely to have a fall. Your health care provider may recommend:  Regular vision checks. Poor vision and conditions such as cataracts can make you more likely to have a fall. If you wear glasses, make sure to get your prescription updated if your vision changes.  Medicine review. Work with your health care provider to regularly review all of the medicines you are taking, including over-the-counter medicines. Ask your health care provider about any side effects that may make you more likely to have a fall. Tell your health care provider if any medicines that you take make you feel dizzy or sleepy.  Osteoporosis screening. Osteoporosis is a condition that causes the bones to get weaker. This can make the bones weak and cause them to break more easily.  Blood pressure screening. Blood pressure changes and medicines to  control blood pressure can make you feel dizzy.  Strength and balance checks. Your health care provider may recommend certain tests to check your strength and balance while standing, walking, or changing positions.  Foot health exam. Foot pain and numbness, as well as not wearing proper footwear, can make you more likely to have a fall.  Depression screening. You may be more likely to have a fall if you have a fear of falling, feel emotionally low, or feel unable to do activities that you used to do.  Alcohol use screening. Using too much alcohol can affect your balance and may make you more likely to have a fall. What actions can I take to lower my risk of falls? General instructions  Talk with your health care provider about your risks for falling. Tell your health care provider if: ? You fall. Be sure to tell your health care provider about all falls, even ones that seem minor. ? You feel dizzy, sleepy, or off-balance.  Take over-the-counter and prescription medicines only as told by your health care provider. These include any supplements.  Eat a healthy diet and maintain a healthy weight. A healthy diet includes low-fat dairy products, low-fat (lean) meats, and fiber from whole grains, beans, and lots of fruits and vegetables. Home safety  Remove any tripping hazards, such as rugs, cords, and clutter.  Install safety equipment such as grab bars in bathrooms and safety rails on stairs.  Keep rooms and walkways well-lit. Activity   Follow a regular exercise program to stay  fit. This will help you maintain your balance. Ask your health care provider what types of exercise are appropriate for you.  If you need a cane or walker, use it as recommended by your health care provider.  Wear supportive shoes that have nonskid soles. Lifestyle  Do not drink alcohol if your health care provider tells you not to drink.  If you drink alcohol, limit how much you have: ? 0-1 drink a day  for women. ? 0-2 drinks a day for men.  Be aware of how much alcohol is in your drink. In the [place] one drink equals one typical bottle of beer (12 oz), one-half glass of wine (5 oz), or one shot of hard liquor (1 oz).  Do not use any products that contain nicotine or tobacco, such as cigarettes and e-cigarettes. If you need help quitting, ask your health care provider. Summary  Having a healthy lifestyle and getting preventive care can help to protect your health and wellness after age 58.  Screening and testing are the best way to find a health problem early and help you avoid having a fall. Early diagnosis and treatment give you the best chance for managing medical conditions that are more common for people who are older than age 72.  Falls are a major cause of broken bones and head injuries in people who are older than age 47. Take precautions to prevent a fall at home.  Work with your health care provider to learn what changes you can make to improve your health and wellness and to prevent falls. This information is not intended to replace advice given to you by your health care provider. Make sure you discuss any questions you have with your health care provider. Document Revised: 04/07/2019 Document Reviewed: 10/28/2017 Elsevier Patient Education  2020 [employer].

## 2020-04-18 NOTE — Progress Notes (Signed)
   Gina Fernandez 01/15/1951 [id number]   History:  69 y.o. D WF G1, P1 presents for breast and pelvic exam.  Postmenopausal on no HRT with no bleeding.  Not sexually active in years.  Normal Pap and mammogram history.  Osteoporosis on Prolia tolerating well.  06/2019 T score - 2.7 improved from -3.1.  Had an unsuccessful colonoscopy greater than 15 years ago.  Current on pneumonia vaccines does not think she has had Shingrix.  Primary care manages labs and meds for hypertension, and anxiety and depression.  Past medical history, past surgical history, family history and social history were all reviewed and documented in the EPIC chart.  Retired high school principal.  Son who lives local had a baby last year, had a recent Tdap.  ROS:  A ROS was performed and pertinent positives and negatives are included.  Exam:  Vitals:   04/18/20 1052  BP: 122/80  Weight: 132 lb (59.9 kg)  Height: 5\' 1"  (1.549 m)   Body mass index is 24.94 kg/m.  General appearance:  Normal Thyroid:  Symmetrical, normal in size, without palpable masses or nodularity. Respiratory  Auscultation:  Clear without wheezing or rhonchi Cardiovascular  Auscultation:  Regular rate, without rubs, murmurs or gallops  Edema/varicosities:  Not grossly evident Abdominal  Soft,nontender, without masses, guarding or rebound.  Liver/spleen:  No organomegaly noted  Hernia:  None appreciated  Skin  Inspection:  Grossly normal   Breasts: Examined lying and sitting.   Right: Without masses, retractions, discharge or axillary adenopathy.   Left: Without masses, retractions, discharge or axillary adenopathy. Gentitourinary   Inguinal/mons:  Normal without inguinal adenopathy  External genitalia:  Normal  BUS/Urethra/Skene's glands:  Normal  Vagina: Atrophic/asymptomatic   Cervix:  Normal  Uterus:   normal in size, shape and contour.  Midline and mobile  Adnexa/parametria:     Rt: Without masses or tenderness.   Lt: Without  masses or tenderness.  Anus and perineum: Normal  Digital rectal exam: Normal sphincter tone without palpated masses or tenderness  Assessment/Plan:  69 y.o. D WF G1, P1 for breast and pelvic exam with no GYN complaints.  Chronic constipation/GI problems causing abdominal cramping for about 1 year.   Postmenopausal no HRT with no bleeding Hypertension, anxiety/depression -primary care manages labs and meds Osteoporosis on Prolia tolerating well with improved T score  Plan: Strongly encouraged GI follow-up,   GI information given instructed to schedule.  SBEs, continue annual 3D screening mammogram, calcium rich foods, vitamin D 2000 IUs daily.  Importance of regular weightbearing and balance type exercise, yoga encouraged.  Home safety, fall prevention discussed.  Had a normal calcium 9.4 primary care 09/2019.  Continue Prolia 60 mg subcu every 6 months.  Shingrix discussed, and encouraged.  Pap screening guidelines reviewed.     [facility], 11:20 AM 04/18/2020

## 2020-04-20 LAB — URINALYSIS, COMPLETE W/RFL CULTURE
Bacteria, UA: NONE SEEN /HPF
Bilirubin Urine: NEGATIVE
Glucose, UA: NEGATIVE
Hgb urine dipstick: NEGATIVE
Hyaline Cast: NONE SEEN /LPF
Ketones, ur: NEGATIVE
Nitrites, Initial: NEGATIVE
Protein, ur: NEGATIVE
Specific Gravity, Urine: 1.026 (ref 1.001–1.03)
pH: 5.5 (ref 5.0–8.0)

## 2020-04-20 LAB — URINE CULTURE
MICRO NUMBER:: 10393271
Result:: NO GROWTH
SPECIMEN QUALITY:: ADEQUATE

## 2020-04-20 LAB — CULTURE INDICATED

## 2020-04-23 ENCOUNTER — Encounter: Payer: Self-pay | Admitting: Women's Health

## 2020-06-05 DIAGNOSIS — M7541 Impingement syndrome of right shoulder: Secondary | ICD-10-CM | POA: Diagnosis not present

## 2020-06-05 NOTE — Telephone Encounter (Signed)
PROLIA GIVEN 02/15/2020 NEXT INJECTION 08/15/2020

## 2020-06-26 ENCOUNTER — Encounter: Payer: Self-pay | Admitting: Obstetrics and Gynecology

## 2020-06-26 ENCOUNTER — Other Ambulatory Visit: Payer: Self-pay

## 2020-06-26 ENCOUNTER — Ambulatory Visit: Payer: Medicare PPO | Admitting: Obstetrics and Gynecology

## 2020-06-26 VITALS — BP 116/76

## 2020-06-26 DIAGNOSIS — N3000 Acute cystitis without hematuria: Secondary | ICD-10-CM | POA: Diagnosis not present

## 2020-06-26 DIAGNOSIS — R350 Frequency of micturition: Secondary | ICD-10-CM

## 2020-06-26 MED ORDER — SULFAMETHOXAZOLE-TRIMETHOPRIM 800-160 MG PO TABS: 1.0000 | ORAL_TABLET | Freq: Two times a day (BID) | ORAL | 0 refills | Status: AC

## 2020-06-26 MED ORDER — FLUCONAZOLE 150 MG PO TABS: 150.0000 mg | ORAL_TABLET | Freq: Once | ORAL | 0 refills | Status: AC

## 2020-06-26 MED ORDER — PHENAZOPYRIDINE HCL 200 MG PO TABS: 200.0000 mg | ORAL_TABLET | Freq: Three times a day (TID) | ORAL | 0 refills | Status: AC | PRN

## 2020-06-26 NOTE — Progress Notes (Signed)
   Gina Fernandez 1951-06-29 [id number]  SUBJECTIVE:  69 y.o. G1P0001 female presents for urinary frequency, pressure and pain with urination.  Also complains of 'urethral spasms.'  No vaginal discharge or itching.  Last had a UTI about a year ago.  Current Outpatient Medications  Medication Sig Dispense Refill  . acetaminophen (TYLENOL) 500 MG tablet Take 1,000 mg by mouth daily.    Marland Kitchen ALPRAZolam (XANAX) 0.5 MG tablet Take 0.5 mg by mouth daily as needed for anxiety. For anxiety     . carvedilol (COREG) 6.25 MG tablet Take 12.5 mg by mouth daily. Takes 2 tablets at same time in the morning    . denosumab (PROLIA) 60 MG/ML SOLN injection Inject 60 mg into the skin every 6 (six) months. Administer in upper arm, thigh, or abdomen    . telmisartan (MICARDIS) 40 MG tablet Take 40 mg by mouth daily.    . triazolam (HALCION) 0.25 MG tablet Take 0.5-0.75 mg by mouth at bedtime. Depends on difficulty sleeping if takes 2-3 tablets    . venlafaxine XR (EFFEXOR-XR) 150 MG 24 hr capsule Take 300 mg by mouth daily.    . Cholecalciferol (VITAMIN D3) 2000 units TABS Take 4,000 Units by mouth daily.  (Patient not taking: Reported on 06/26/2020)    . fluconazole (DIFLUCAN) 150 MG tablet Take 1 tablet (150 mg total) by mouth once for 1 dose. Take on the 3rd day of bladder antibiotic treatment 1 tablet 0  . phenazopyridine (PYRIDIUM) 200 MG tablet Take 1 tablet (200 mg total) by mouth 3 (three) times daily as needed for up to 2 days for pain. 6 tablet 0  . sulfamethoxazole-trimethoprim (BACTRIM DS) 800-160 MG tablet Take 1 tablet by mouth 2 (two) times daily for 5 days. 10 tablet 0   No current facility-administered medications for this visit.   Allergies: Penicillins  Patient's last menstrual period was 12/29/2004.  Past medical history,surgical history, problem list, medications, allergies, family history and social history were all reviewed and documented as reviewed in the EPIC chart.  ROS:  Feeling well.  No dyspnea or chest pain on exertion.  No abdominal pain, change in bowel habits, black or bloody stools.  + urinary tract symptoms. GYN ROS: No vaginal bleeding   OBJECTIVE:  BP 116/76   LMP 12/29/2004  The patient appears well, alert, oriented x 3, in no distress. PELVIC EXAM: Deferred Urinalysis >60 WBC, 0-2 RBC, 6-10 squamous epithelial cells, many bacteria, no yeast, 2+ leukocyte esterase, negative nitrite, cloudy  Chaperone: Caryn Bee present during the examination  ASSESSMENT:  69 y.o. G1P0001 here with presumed acute cystitis  PLAN:  Bactrim DS twice daily for 5 days, also gave prescription for Pyridium for more immediate relief, 200 mg 3 times daily as needed.  We will follow up on the urine culture.  She typically gets a vaginal yeast infection after taking antibiotics for a UTI so I did give her a single tablet of 150 mg fluconazole which she will take on the second or third day of antibiotic treatment.  She will follow up if symptoms do not improve.   Joseph Pierini MD 06/26/20

## 2020-06-27 ENCOUNTER — Telehealth: Payer: Self-pay

## 2020-06-27 NOTE — Telephone Encounter (Signed)
She can just use OTC Azo Phenazopyridine for bladder if needed, otherwise just don't use anything additional and wait for the antibiotics to work

## 2020-06-27 NOTE — Telephone Encounter (Signed)
Note from pharmacy regarding Rx Phenazopyridine 200 mg tablets.    "This drug is not covered by patient plan. The preferred alternative is ALTDRUGTHERAPYPREFERREDPRODUCT REQUIRED." Please contact the pharmacy to change medication."

## 2020-06-28 LAB — URINE CULTURE
MICRO NUMBER:: 10647191
SPECIMEN QUALITY:: ADEQUATE

## 2020-06-28 LAB — URINALYSIS, COMPLETE W/RFL CULTURE
Bilirubin Urine: NEGATIVE
Glucose, UA: NEGATIVE
Hyaline Cast: NONE SEEN /LPF
Ketones, ur: NEGATIVE
Nitrites, Initial: NEGATIVE
Protein, ur: NEGATIVE
Specific Gravity, Urine: 1.025 (ref 1.001–1.03)
WBC, UA: >=60 /HPF — AB (ref 0–5)
pH: 5.5 (ref 5.0–8.0)

## 2020-06-28 LAB — CULTURE INDICATED

## 2020-06-28 NOTE — Telephone Encounter (Signed)
Patient advised. Pharmacy informed.

## 2020-07-17 ENCOUNTER — Telehealth: Payer: Self-pay | Admitting: *Deleted

## 2020-07-17 NOTE — Telephone Encounter (Signed)
Deductible N/A  OOP MAX $4000 ($200MET)  [place]ual exam 04/18/2020 [place]  Calcium 9.4            Date 10/17/2019  Upcoming dental procedures   Prior Authorization needed YES ON FILE APPROVED THROUGH 12/28/2020  Pt estimated Cost $0  APPT 08/15/2020 @ 9:00    Coverage Details: 0% ONE DOSE, 0% ADMIN FEE

## 2020-08-08 ENCOUNTER — Other Ambulatory Visit: Payer: Self-pay | Admitting: Family Medicine

## 2020-08-08 DIAGNOSIS — Z1231 Encounter for screening mammogram for malignant neoplasm of breast: Secondary | ICD-10-CM

## 2020-08-13 ENCOUNTER — Other Ambulatory Visit: Payer: Self-pay

## 2020-08-13 ENCOUNTER — Ambulatory Visit
Admission: RE | Admit: 2020-08-13 | Discharge: 2020-08-13 | Disposition: A | Discharge status: Home / Self Care | Payer: Medicare PPO | Source: Ambulatory Visit | Attending: Family Medicine | Admitting: Family Medicine

## 2020-08-13 DIAGNOSIS — M25511 Pain in right shoulder: Secondary | ICD-10-CM | POA: Diagnosis not present

## 2020-08-13 DIAGNOSIS — Z1231 Encounter for screening mammogram for malignant neoplasm of breast: Secondary | ICD-10-CM

## 2020-08-15 ENCOUNTER — Ambulatory Visit: Payer: Medicare PPO

## 2020-08-20 ENCOUNTER — Ambulatory Visit: Payer: Medicare PPO

## 2020-08-29 HISTORY — PX: SHOULDER SURGERY: SHX246

## 2020-08-30 DIAGNOSIS — M25511 Pain in right shoulder: Secondary | ICD-10-CM | POA: Diagnosis not present

## 2020-09-07 DIAGNOSIS — M7541 Impingement syndrome of right shoulder: Secondary | ICD-10-CM | POA: Diagnosis not present

## 2020-09-25 DIAGNOSIS — M75121 Complete rotator cuff tear or rupture of right shoulder, not specified as traumatic: Secondary | ICD-10-CM | POA: Diagnosis not present

## 2020-09-25 DIAGNOSIS — I890 Lymphedema, not elsewhere classified: Secondary | ICD-10-CM | POA: Diagnosis not present

## 2020-09-25 DIAGNOSIS — M19011 Primary osteoarthritis, right shoulder: Secondary | ICD-10-CM | POA: Diagnosis not present

## 2020-09-25 DIAGNOSIS — S43491A Other sprain of right shoulder joint, initial encounter: Secondary | ICD-10-CM | POA: Diagnosis not present

## 2020-09-25 DIAGNOSIS — M7541 Impingement syndrome of right shoulder: Secondary | ICD-10-CM | POA: Diagnosis not present

## 2020-09-25 DIAGNOSIS — G8918 Other acute postprocedural pain: Secondary | ICD-10-CM | POA: Diagnosis not present

## 2020-09-25 DIAGNOSIS — S46011A Strain of muscle(s) and tendon(s) of the rotator cuff of right shoulder, initial encounter: Secondary | ICD-10-CM | POA: Diagnosis not present

## 2020-10-04 DIAGNOSIS — R0981 Nasal congestion: Secondary | ICD-10-CM | POA: Diagnosis not present

## 2020-10-05 DIAGNOSIS — M25511 Pain in right shoulder: Secondary | ICD-10-CM | POA: Diagnosis not present

## 2020-10-12 DIAGNOSIS — M25511 Pain in right shoulder: Secondary | ICD-10-CM | POA: Diagnosis not present

## 2020-10-17 DIAGNOSIS — M25511 Pain in right shoulder: Secondary | ICD-10-CM | POA: Diagnosis not present

## 2020-10-22 DIAGNOSIS — F3341 Major depressive disorder, recurrent, in partial remission: Secondary | ICD-10-CM | POA: Diagnosis not present

## 2020-10-25 DIAGNOSIS — I890 Lymphedema, not elsewhere classified: Secondary | ICD-10-CM | POA: Diagnosis not present

## 2020-10-25 DIAGNOSIS — M25511 Pain in right shoulder: Secondary | ICD-10-CM | POA: Diagnosis not present

## 2020-10-25 DIAGNOSIS — M19011 Primary osteoarthritis, right shoulder: Secondary | ICD-10-CM | POA: Diagnosis not present

## 2020-10-25 DIAGNOSIS — M7541 Impingement syndrome of right shoulder: Secondary | ICD-10-CM | POA: Diagnosis not present

## 2020-11-06 DIAGNOSIS — M25511 Pain in right shoulder: Secondary | ICD-10-CM | POA: Diagnosis not present

## 2020-11-19 DIAGNOSIS — Z1389 Encounter for screening for other disorder: Secondary | ICD-10-CM | POA: Diagnosis not present

## 2020-11-19 DIAGNOSIS — Z0000 Encounter for general adult medical examination without abnormal findings: Secondary | ICD-10-CM | POA: Diagnosis not present

## 2020-11-19 DIAGNOSIS — Z23 Encounter for immunization: Secondary | ICD-10-CM | POA: Diagnosis not present

## 2020-12-10 DIAGNOSIS — M25511 Pain in right shoulder: Secondary | ICD-10-CM | POA: Diagnosis not present

## 2021-01-21 DIAGNOSIS — F3341 Major depressive disorder, recurrent, in partial remission: Secondary | ICD-10-CM | POA: Diagnosis not present

## 2021-01-22 DIAGNOSIS — Z1211 Encounter for screening for malignant neoplasm of colon: Secondary | ICD-10-CM | POA: Diagnosis not present

## 2021-01-22 DIAGNOSIS — E559 Vitamin D deficiency, unspecified: Secondary | ICD-10-CM | POA: Diagnosis not present

## 2021-01-22 DIAGNOSIS — I10 Essential (primary) hypertension: Secondary | ICD-10-CM | POA: Diagnosis not present

## 2021-01-22 DIAGNOSIS — R7301 Impaired fasting glucose: Secondary | ICD-10-CM | POA: Diagnosis not present

## 2021-01-22 DIAGNOSIS — M810 Age-related osteoporosis without current pathological fracture: Secondary | ICD-10-CM | POA: Diagnosis not present

## 2021-01-22 DIAGNOSIS — E782 Mixed hyperlipidemia: Secondary | ICD-10-CM | POA: Diagnosis not present

## 2021-01-22 DIAGNOSIS — E538 Deficiency of other specified B group vitamins: Secondary | ICD-10-CM | POA: Diagnosis not present

## 2021-01-22 DIAGNOSIS — R21 Rash and other nonspecific skin eruption: Secondary | ICD-10-CM | POA: Diagnosis not present

## 2021-01-23 DIAGNOSIS — R7301 Impaired fasting glucose: Secondary | ICD-10-CM | POA: Diagnosis not present

## 2021-01-23 DIAGNOSIS — I10 Essential (primary) hypertension: Secondary | ICD-10-CM | POA: Diagnosis not present

## 2021-01-23 DIAGNOSIS — Z1211 Encounter for screening for malignant neoplasm of colon: Secondary | ICD-10-CM | POA: Diagnosis not present

## 2021-01-23 DIAGNOSIS — E782 Mixed hyperlipidemia: Secondary | ICD-10-CM | POA: Diagnosis not present

## 2021-01-23 DIAGNOSIS — E538 Deficiency of other specified B group vitamins: Secondary | ICD-10-CM | POA: Diagnosis not present

## 2021-01-23 DIAGNOSIS — R21 Rash and other nonspecific skin eruption: Secondary | ICD-10-CM | POA: Diagnosis not present

## 2021-01-23 DIAGNOSIS — E559 Vitamin D deficiency, unspecified: Secondary | ICD-10-CM | POA: Diagnosis not present

## 2021-01-23 DIAGNOSIS — M810 Age-related osteoporosis without current pathological fracture: Secondary | ICD-10-CM | POA: Diagnosis not present

## 2021-02-11 NOTE — Telephone Encounter (Signed)
Pt has canceled last 2 appts for Prolia. I will send letter out and close encounter.

## 2021-02-25 ENCOUNTER — Telehealth: Payer: Self-pay | Admitting: *Deleted

## 2021-02-25 NOTE — Telephone Encounter (Signed)
Deductible n/a  OOP MAX  n/a  [place] exam 04/18/2020 [place]  Calcium 10.5            Date 01/23/21  Upcoming dental procedures   Prior Authorization needed yes approved previous prior auth was extended to 12/28/2021 Josem Kaufmann #[id number] call reference #[id number]   Pt estimated Cost: Product will be covered at 100% of the contracted rate. No deductible or coinsurance applies    appt 02/26/2021   Coverage Details:S: Product will be covered at 100% of the contracted rate. No deductible or coinsurance applies

## 2021-02-26 ENCOUNTER — Other Ambulatory Visit: Payer: Self-pay

## 2021-02-26 ENCOUNTER — Ambulatory Visit (INDEPENDENT_AMBULATORY_CARE_PROVIDER_SITE_OTHER): Payer: Medicare PPO

## 2021-02-26 VITALS — BP 142/82 | HR 68 | Ht 61.0 in | Wt 130.0 lb

## 2021-02-26 DIAGNOSIS — M810 Age-related osteoporosis without current pathological fracture: Secondary | ICD-10-CM

## 2021-02-26 MED ORDER — DENOSUMAB 60 MG/ML ~~LOC~~ SOSY
60.0000 mg | PREFILLED_SYRINGE | Freq: Once | SUBCUTANEOUS | Status: AC
  Administered 2021-02-26: 60 mg via SUBCUTANEOUS

## 2021-04-18 IMAGING — CT CT RENAL STONE PROTOCOL
2 of 4 series · 16 of 46 positions shown, 18 images · non-contrast
Comparison: 12/25/2018

CLINICAL DATA: Severe right back and flank pain for 2 weeks.
Nausea.

EXAM:
CT ABDOMEN AND PELVIS WITHOUT CONTRAST
TECHNIQUE: Multidetector CT imaging of the abdomen and pelvis was performed
following the standard protocol without IV contrast.

[Series 2: axial st · axial · 0.79mm/px · z∈[-363,-8]mm · 13 of 79 slices shown, 15 images]
[im 4/79  soft-tissue]
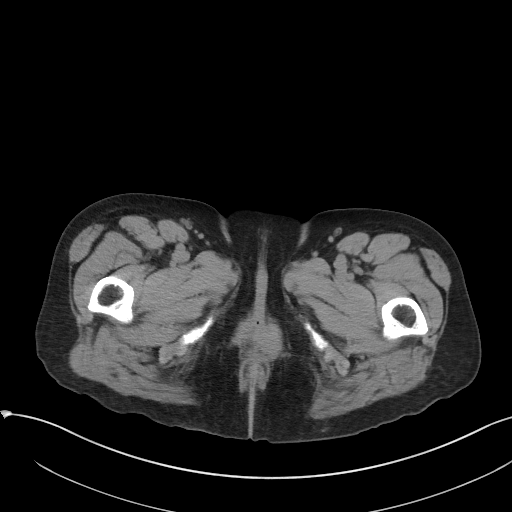
[im 4/79  bone]
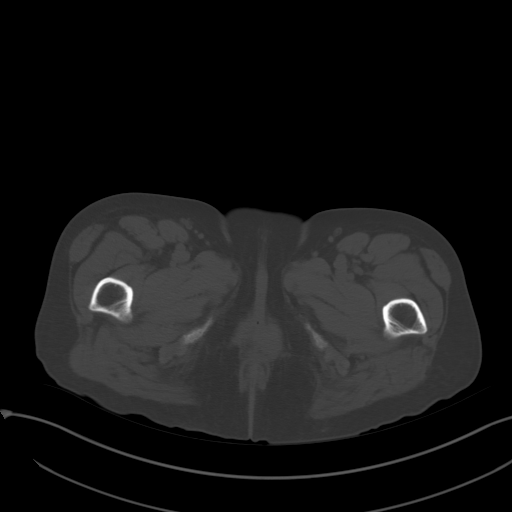
[im 10/79  soft-tissue]
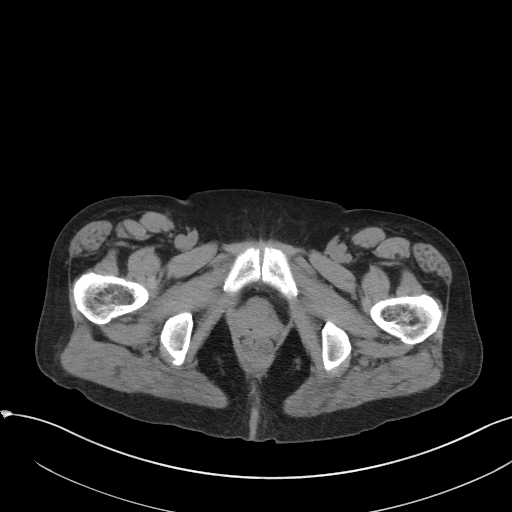
[im 17/79  soft-tissue]
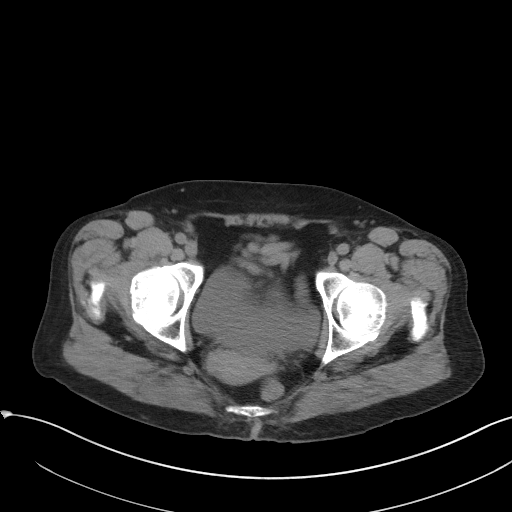
[im 23/79  soft-tissue]
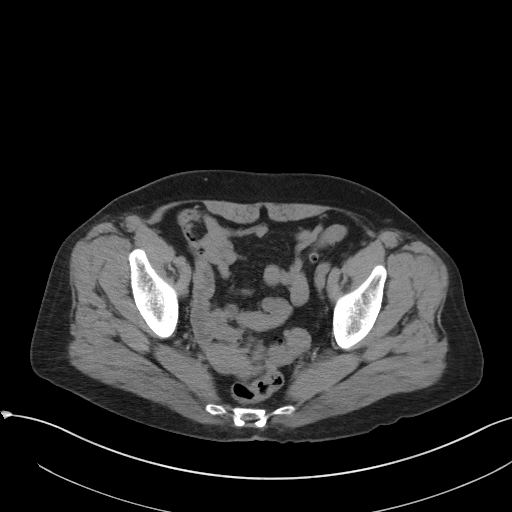
[im 27/79  soft-tissue]
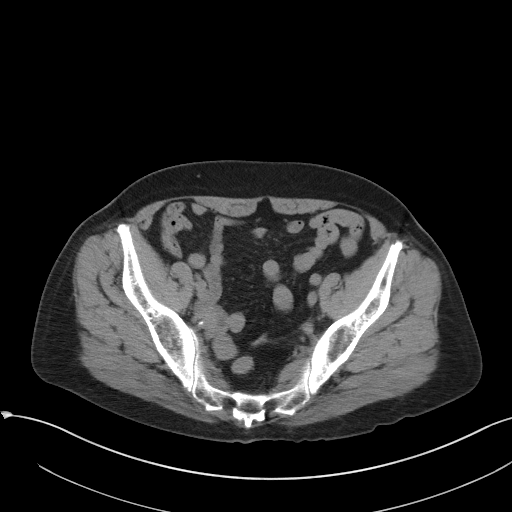
[im 33/79  soft-tissue]
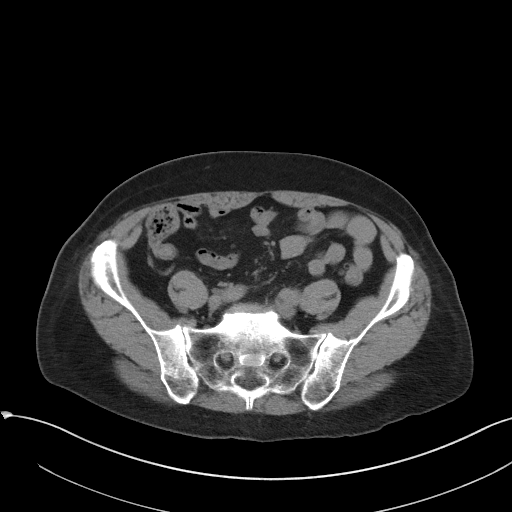
[im 40/79  soft-tissue]
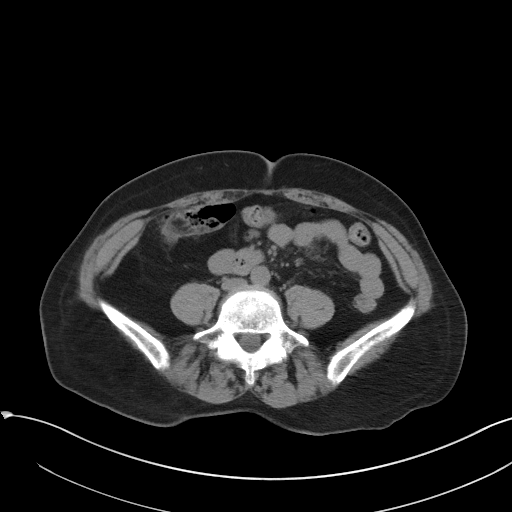
[im 46/79  soft-tissue]
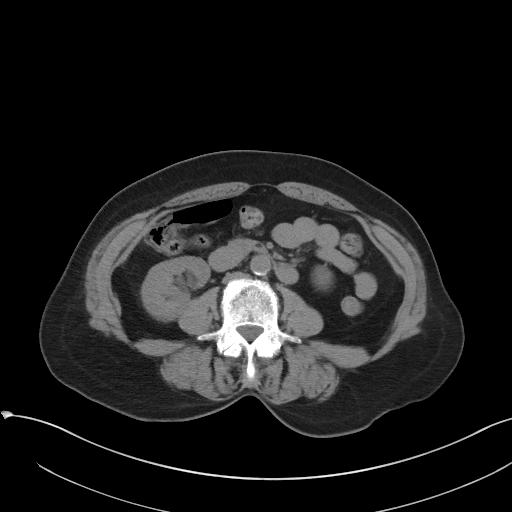
[im 53/79  soft-tissue]
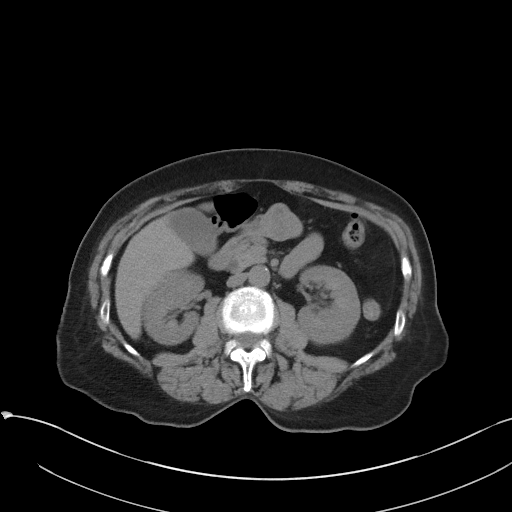
[im 53/79  bone]
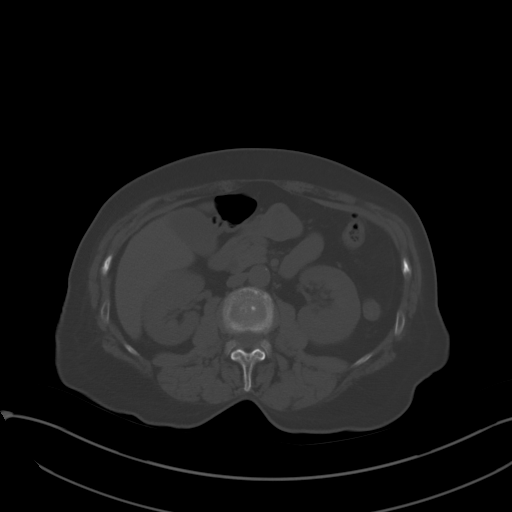
[im 56/79  soft-tissue]
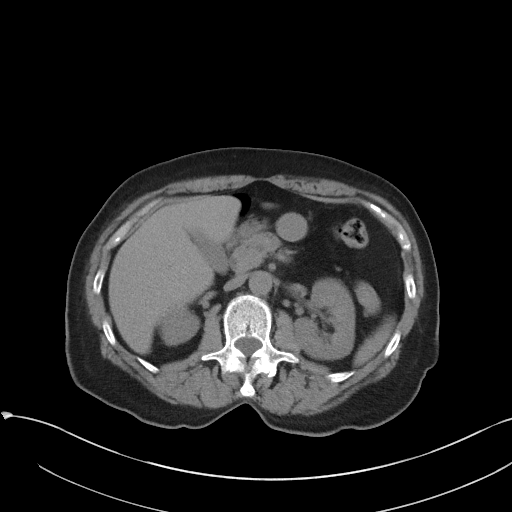
[im 62/79  soft-tissue]
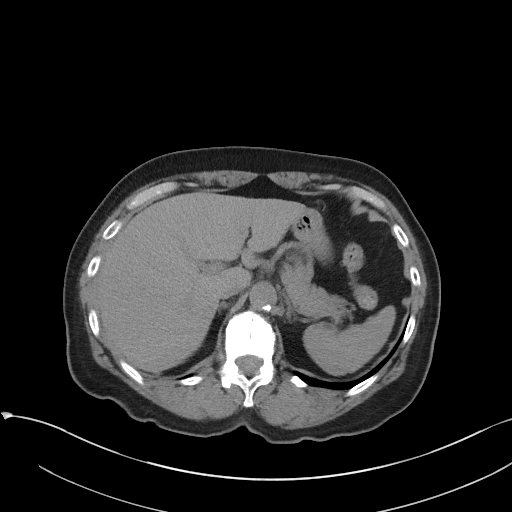
[im 69/79  soft-tissue]
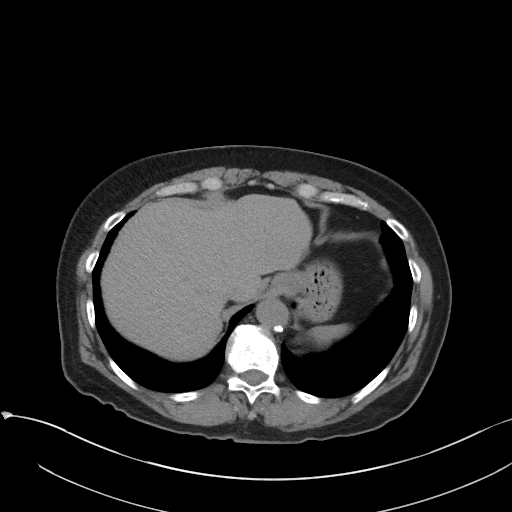
[im 75/79  soft-tissue]
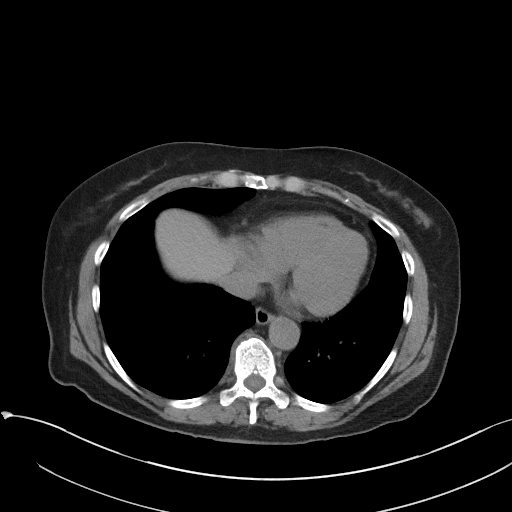

[Series 5: coronal st · coronal · 0.66mm/px · 3 of 80 slices shown]
[im 27/80  soft-tissue]
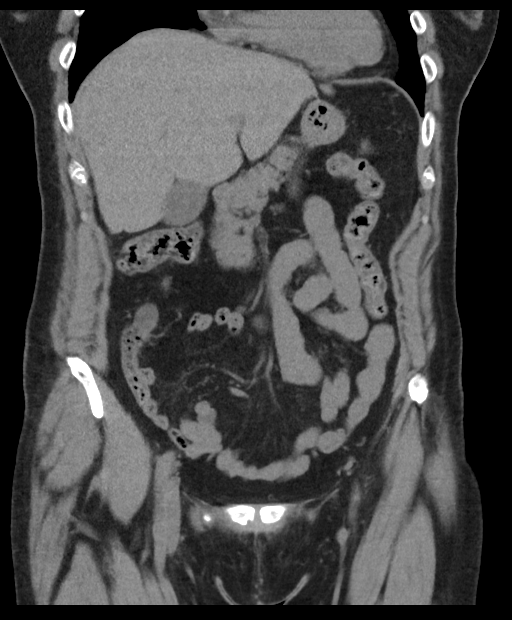
[im 36/80  soft-tissue]
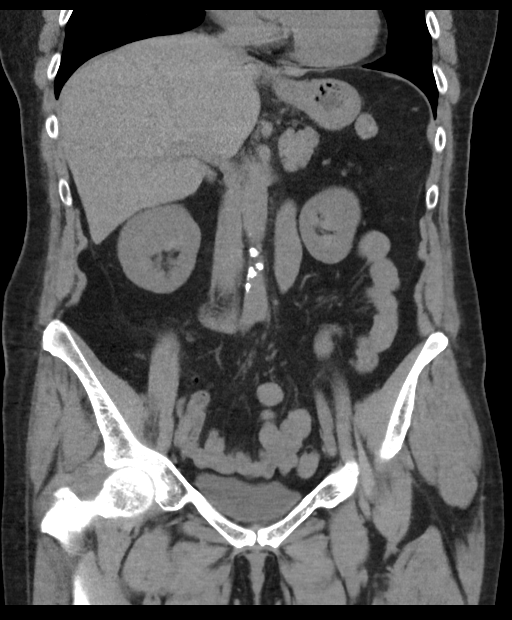
[im 44/80  soft-tissue]
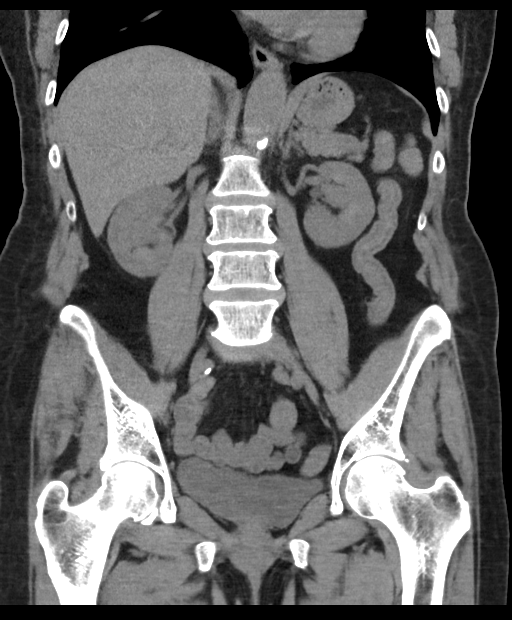

[16 of 46 positions shown; findings below may reference images not displayed]

FINDINGS: Lower chest: No acute findings.

Hepatobiliary: No mass visualized on this unenhanced exam.
Gallbladder is unremarkable. No evidence of biliary ductal
dilatation.

Pancreas: No mass or inflammatory process visualized on this
unenhanced exam.

Spleen:  Within normal limits in size.

Adrenals/Urinary tract: No evidence of urolithiasis or
hydronephrosis. Unremarkable unopacified urinary bladder.

Stomach/Bowel: No evidence of obstruction, inflammatory process, or
abnormal fluid collections. Normal appendix visualized.
Diverticulosis is seen mainly involving the descending and sigmoid
colon, however there is no evidence of diverticulitis.

Vascular/Lymphatic: No pathologically enlarged lymph nodes
identified. No evidence of abdominal aortic aneurysm. Aortic
atherosclerosis.

Reproductive:  No mass or other significant abnormality.

Other:  None.

Musculoskeletal: No suspicious bone lesions identified. Previous
vertebroplasty seen at compression fracture of the L1 7vertebral
body.
IMPRESSION: No evidence of urolithiasis, hydronephrosis, or other acute
findings.

Colonic diverticulosis, without radiographic evidence of
diverticulitis.

Aortic Atherosclerosis ([id number]).

## 2021-06-24 DIAGNOSIS — F3341 Major depressive disorder, recurrent, in partial remission: Secondary | ICD-10-CM | POA: Diagnosis not present

## 2021-08-21 NOTE — Telephone Encounter (Signed)
[place] 02/26/2021 NEXT INJECTION 08/30/2021

## 2021-08-28 ENCOUNTER — Telehealth: Payer: Self-pay | Admitting: *Deleted

## 2021-08-28 DIAGNOSIS — M810 Age-related osteoporosis without current pathological fracture: Secondary | ICD-10-CM

## 2021-08-28 NOTE — Telephone Encounter (Addendum)
Deductible N/A  OOP MAX N/A  Annual exam 10/10/2021  Calcium 10.4            Date 10/10/2021   Upcoming dental procedures NO  Prior Authorization needed yes approved previous prior auth was extended to 12/28/2021 auth [id number] call reference [id number]  Pt estimated Cost $0   Appt 10/21/2021   Coverage Details:Product will be covered at 100% of the contracted rate. No deductible or coinsurance applies

## 2021-09-23 ENCOUNTER — Other Ambulatory Visit: Payer: Self-pay | Admitting: Family Medicine

## 2021-09-23 DIAGNOSIS — Z1231 Encounter for screening mammogram for malignant neoplasm of breast: Secondary | ICD-10-CM

## 2021-09-25 ENCOUNTER — Ambulatory Visit
Admission: RE | Admit: 2021-09-25 | Discharge: 2021-09-25 | Disposition: A | Discharge status: Home / Self Care | Payer: Medicare PPO | Source: Ambulatory Visit | Attending: Family Medicine | Admitting: Family Medicine

## 2021-09-25 ENCOUNTER — Other Ambulatory Visit: Payer: Self-pay

## 2021-09-25 DIAGNOSIS — Z1231 Encounter for screening mammogram for malignant neoplasm of breast: Secondary | ICD-10-CM

## 2021-09-26 DIAGNOSIS — Z207 Contact with and (suspected) exposure to pediculosis, acariasis and other infestations: Secondary | ICD-10-CM | POA: Diagnosis not present

## 2021-09-26 DIAGNOSIS — R1013 Epigastric pain: Secondary | ICD-10-CM | POA: Diagnosis not present

## 2021-09-30 DIAGNOSIS — Z207 Contact with and (suspected) exposure to pediculosis, acariasis and other infestations: Secondary | ICD-10-CM | POA: Diagnosis not present

## 2021-09-30 DIAGNOSIS — R1013 Epigastric pain: Secondary | ICD-10-CM | POA: Diagnosis not present

## 2021-10-10 ENCOUNTER — Encounter: Payer: Self-pay | Admitting: Nurse Practitioner

## 2021-10-10 ENCOUNTER — Ambulatory Visit (INDEPENDENT_AMBULATORY_CARE_PROVIDER_SITE_OTHER): Payer: Medicare PPO | Admitting: Nurse Practitioner

## 2021-10-10 ENCOUNTER — Other Ambulatory Visit: Payer: Self-pay

## 2021-10-10 VITALS — BP 122/78 | Ht 60.0 in | Wt 126.0 lb

## 2021-10-10 DIAGNOSIS — Z01419 Encounter for gynecological examination (general) (routine) without abnormal findings: Secondary | ICD-10-CM

## 2021-10-10 DIAGNOSIS — R350 Frequency of micturition: Secondary | ICD-10-CM | POA: Diagnosis not present

## 2021-10-10 DIAGNOSIS — M810 Age-related osteoporosis without current pathological fracture: Secondary | ICD-10-CM

## 2021-10-10 DIAGNOSIS — Z1211 Encounter for screening for malignant neoplasm of colon: Secondary | ICD-10-CM

## 2021-10-10 DIAGNOSIS — Z780 Asymptomatic menopausal state: Secondary | ICD-10-CM

## 2021-10-10 LAB — URINALYSIS W MICROSCOPIC + REFLEX CULTURE
Bacteria, UA: NONE SEEN /HPF
Crystals: NONE SEEN /HPF
Glucose, UA: NEGATIVE
Hgb urine dipstick: NEGATIVE
Ketones, ur: NEGATIVE
Leukocyte Esterase: NEGATIVE
Nitrites, Initial: NEGATIVE
Protein, ur: NEGATIVE
RBC / HPF: NONE SEEN /HPF (ref 0–2)
Specific Gravity, Urine: 1.02 (ref 1.001–1.035)
WBC, UA: NONE SEEN /HPF (ref 0–5)
Yeast: NONE SEEN /HPF
pH: 5.5 (ref 5.0–8.0)

## 2021-10-10 LAB — NO CULTURE INDICATED

## 2021-10-10 NOTE — Progress Notes (Signed)
   Gina Fernandez April 05, 1951 [id number]   History:  70 y.o. G1P0001 presents for breast and pelvic exam. No GYN complaints. Postmenopausal - no HRT, no bleeding. Normal pap history. Osteoporosis, on Prolia.   Gynecologic History Patient's last menstrual period was 12/29/2004.   Contraception: post menopausal status Sexually active: No  Health Maintenance Last Pap: 03/13/2017. Results were: Normal Last mammogram: 09/25/2021. Results were: Normal Last colonoscopy: 10-12 years ago Last Dexa: 07/14/2019. Results were: T-score -2.7  Past medical history, past surgical history, family history and social history were all reviewed and documented in the EPIC chart. Son, 2 grandchildren.  ROS:  A ROS was performed and pertinent positives and negatives are included.  Exam:  Vitals:   10/10/21 1347  BP: 122/78  Weight: 126 lb (57.2 kg)  Height: 5' (1.524 m)   Body mass index is 24.61 kg/m.  General appearance:  Normal Thyroid:  Symmetrical, normal in size, without palpable masses or nodularity. Respiratory  Auscultation:  Clear without wheezing or rhonchi Cardiovascular  Auscultation:  Regular rate, without rubs, murmurs or gallops  Edema/varicosities:  Not grossly evident Abdominal  Soft,nontender, without masses, guarding or rebound.  Liver/spleen:  No organomegaly noted  Hernia:  None appreciated  Skin  Inspection:  Grossly normal Breasts: Examined lying and sitting.   Right: Without masses, retractions, nipple discharge or axillary adenopathy.   Left: Without masses, retractions, nipple discharge or axillary adenopathy. Genitourinary   Inguinal/mons:  Normal without inguinal adenopathy  External genitalia:  Normal appearing vulva with no masses, tenderness, or lesions  BUS/Urethra/Skene's glands:  Normal  Vagina:  Atrophic changes  Cervix:  Normal appearing without discharge or lesions  Uterus:  Normal in size, shape and contour.  Midline and mobile,  nontender  Adnexa/parametria:     Rt: Normal in size, without masses or tenderness.   Lt: Normal in size, without masses or tenderness.  Anus and perineum: Normal  Digital rectal exam: Normal sphincter tone without palpated masses or tenderness  Patient informed chaperone available to be present for breast and pelvic exam. Patient has requested no chaperone to be present. Patient has been advised what will be completed during breast and pelvic exam.   Assessment/Plan:  70 y.o. G1P0001 for breast and pelvic exam.   Well female exam with routine gynecological exam - Education provided on SBEs, importance of preventative screenings, current guidelines, high calcium diet, regular exercise, and multivitamin daily. Labs with PCP.   Postmenopausal - no HRT, no bleeding.   Age-related osteoporosis without current pathological fracture - Plan: DG Bone Density. Prolia every 6 months. Doing well on this. Will schedule DXA now.   Screening for colon cancer - Plan: Cologuard. Colonoscopy 10-12 years ago. Reports that they were unable to complete due to anatomy. She is agreeable to cologuard.   Urinary frequency - Plan: Urinalysis w microscopic + reflex culture.   Screening for cervical cancer - Normal Pap history.  No longer screening per guidelines.   Screening for breast cancer - Normal mammogram history.  Continue annual screenings.  Normal breast exam today.  Return in 2 years for breast and pelvic exam.   Tamela Gammon DNP, 2:13 PM 10/10/2021

## 2021-10-11 LAB — CALCIUM: Calcium: 10.4 mg/dL (ref 8.6–10.4)

## 2021-10-21 ENCOUNTER — Ambulatory Visit (INDEPENDENT_AMBULATORY_CARE_PROVIDER_SITE_OTHER): Payer: Medicare PPO | Admitting: Gynecology

## 2021-10-21 ENCOUNTER — Other Ambulatory Visit: Payer: Self-pay

## 2021-10-21 DIAGNOSIS — M810 Age-related osteoporosis without current pathological fracture: Secondary | ICD-10-CM | POA: Diagnosis not present

## 2021-10-21 MED ORDER — DENOSUMAB 60 MG/ML ~~LOC~~ SOSY
60.0000 mg | PREFILLED_SYRINGE | Freq: Once | SUBCUTANEOUS | Status: AC
  Administered 2021-10-21: 60 mg via SUBCUTANEOUS

## 2021-10-24 DIAGNOSIS — Z1211 Encounter for screening for malignant neoplasm of colon: Secondary | ICD-10-CM | POA: Diagnosis not present

## 2021-10-29 LAB — COLOGUARD: COLOGUARD: NEGATIVE

## 2021-11-12 ENCOUNTER — Other Ambulatory Visit: Payer: Self-pay | Admitting: Nurse Practitioner

## 2021-11-12 ENCOUNTER — Other Ambulatory Visit: Payer: Self-pay

## 2021-11-12 ENCOUNTER — Ambulatory Visit (INDEPENDENT_AMBULATORY_CARE_PROVIDER_SITE_OTHER): Payer: Medicare PPO

## 2021-11-12 DIAGNOSIS — M8589 Other specified disorders of bone density and structure, multiple sites: Secondary | ICD-10-CM

## 2021-11-12 DIAGNOSIS — Z780 Asymptomatic menopausal state: Secondary | ICD-10-CM

## 2021-11-12 DIAGNOSIS — M810 Age-related osteoporosis without current pathological fracture: Secondary | ICD-10-CM

## 2021-11-13 DIAGNOSIS — J029 Acute pharyngitis, unspecified: Secondary | ICD-10-CM | POA: Diagnosis not present

## 2021-11-13 DIAGNOSIS — Z20828 Contact with and (suspected) exposure to other viral communicable diseases: Secondary | ICD-10-CM | POA: Diagnosis not present

## 2021-11-13 DIAGNOSIS — R059 Cough, unspecified: Secondary | ICD-10-CM | POA: Diagnosis not present

## 2021-11-25 DIAGNOSIS — F3341 Major depressive disorder, recurrent, in partial remission: Secondary | ICD-10-CM | POA: Diagnosis not present

## 2021-11-26 DIAGNOSIS — R490 Dysphonia: Secondary | ICD-10-CM | POA: Diagnosis not present

## 2021-11-26 DIAGNOSIS — E559 Vitamin D deficiency, unspecified: Secondary | ICD-10-CM | POA: Diagnosis not present

## 2021-11-26 DIAGNOSIS — E782 Mixed hyperlipidemia: Secondary | ICD-10-CM | POA: Diagnosis not present

## 2021-11-26 DIAGNOSIS — R7301 Impaired fasting glucose: Secondary | ICD-10-CM | POA: Diagnosis not present

## 2021-11-26 DIAGNOSIS — E538 Deficiency of other specified B group vitamins: Secondary | ICD-10-CM | POA: Diagnosis not present

## 2021-11-26 DIAGNOSIS — K119 Disease of salivary gland, unspecified: Secondary | ICD-10-CM | POA: Diagnosis not present

## 2021-11-26 DIAGNOSIS — Z23 Encounter for immunization: Secondary | ICD-10-CM | POA: Diagnosis not present

## 2021-11-26 DIAGNOSIS — I10 Essential (primary) hypertension: Secondary | ICD-10-CM | POA: Diagnosis not present

## 2021-11-26 DIAGNOSIS — Z0000 Encounter for general adult medical examination without abnormal findings: Secondary | ICD-10-CM | POA: Diagnosis not present

## 2021-11-26 NOTE — Telephone Encounter (Signed)
Summary of benefits scanned into Epic.   Encounter closed.

## 2021-11-26 NOTE — Telephone Encounter (Signed)
Patient received Prolia injection on 10-21-21.

## 2021-12-03 ENCOUNTER — Other Ambulatory Visit: Payer: Self-pay | Admitting: Family Medicine

## 2021-12-03 DIAGNOSIS — K119 Disease of salivary gland, unspecified: Secondary | ICD-10-CM

## 2021-12-17 ENCOUNTER — Ambulatory Visit
Admission: RE | Admit: 2021-12-17 | Discharge: 2021-12-17 | Disposition: A | Discharge status: Home / Self Care | Payer: Medicare PPO | Source: Ambulatory Visit | Attending: Family Medicine | Admitting: Family Medicine

## 2021-12-17 DIAGNOSIS — K119 Disease of salivary gland, unspecified: Secondary | ICD-10-CM

## 2021-12-17 DIAGNOSIS — R221 Localized swelling, mass and lump, neck: Secondary | ICD-10-CM | POA: Diagnosis not present

## 2021-12-17 DIAGNOSIS — K118 Other diseases of salivary glands: Secondary | ICD-10-CM | POA: Diagnosis not present

## 2021-12-27 ENCOUNTER — Other Ambulatory Visit: Payer: Self-pay | Admitting: Family Medicine

## 2021-12-27 ENCOUNTER — Ambulatory Visit
Admission: RE | Admit: 2021-12-27 | Discharge: 2021-12-27 | Disposition: A | Discharge status: Home / Self Care | Payer: Medicare PPO | Source: Ambulatory Visit | Attending: Family Medicine | Admitting: Family Medicine

## 2021-12-27 DIAGNOSIS — M2669 Other specified disorders of temporomandibular joint: Secondary | ICD-10-CM | POA: Diagnosis not present

## 2021-12-27 DIAGNOSIS — R9389 Abnormal findings on diagnostic imaging of other specified body structures: Secondary | ICD-10-CM

## 2021-12-27 DIAGNOSIS — I6529 Occlusion and stenosis of unspecified carotid artery: Secondary | ICD-10-CM | POA: Diagnosis not present

## 2021-12-27 DIAGNOSIS — K118 Other diseases of salivary glands: Secondary | ICD-10-CM | POA: Diagnosis not present

## 2021-12-27 DIAGNOSIS — M47812 Spondylosis without myelopathy or radiculopathy, cervical region: Secondary | ICD-10-CM | POA: Diagnosis not present

## 2021-12-27 MED ORDER — IOPAMIDOL (ISOVUE-300) INJECTION 61%
75.0000 mL | Freq: Once | INTRAVENOUS | Status: AC | PRN
  Administered 2021-12-27: 75 mL via INTRAVENOUS

## 2021-12-31 ENCOUNTER — Other Ambulatory Visit: Payer: Medicare PPO

## 2022-01-20 DIAGNOSIS — D37030 Neoplasm of uncertain behavior of the parotid salivary glands: Secondary | ICD-10-CM | POA: Diagnosis not present

## 2022-01-23 ENCOUNTER — Other Ambulatory Visit: Payer: Self-pay | Admitting: Otolaryngology

## 2022-02-06 ENCOUNTER — Ambulatory Visit
Admission: EM | Admit: 2022-02-06 | Discharge: 2022-02-06 | Disposition: A | Discharge status: Home / Self Care | Payer: Medicare PPO | Attending: Emergency Medicine | Admitting: Emergency Medicine

## 2022-02-06 ENCOUNTER — Encounter: Payer: Self-pay | Admitting: Emergency Medicine

## 2022-02-06 ENCOUNTER — Other Ambulatory Visit: Payer: Self-pay

## 2022-02-06 DIAGNOSIS — R112 Nausea with vomiting, unspecified: Secondary | ICD-10-CM | POA: Diagnosis not present

## 2022-02-06 DIAGNOSIS — R197 Diarrhea, unspecified: Secondary | ICD-10-CM | POA: Diagnosis not present

## 2022-02-06 DIAGNOSIS — B349 Viral infection, unspecified: Secondary | ICD-10-CM | POA: Diagnosis not present

## 2022-02-06 DIAGNOSIS — Z03818 Encounter for observation for suspected exposure to other biological agents ruled out: Secondary | ICD-10-CM | POA: Diagnosis not present

## 2022-02-06 DIAGNOSIS — E860 Dehydration: Secondary | ICD-10-CM | POA: Diagnosis not present

## 2022-02-06 MED ORDER — ONDANSETRON 8 MG PO TBDP: 8.0000 mg | ORAL_TABLET | Freq: Three times a day (TID) | ORAL | 0 refills | Status: DC | PRN

## 2022-02-06 MED ORDER — SODIUM CHLORIDE 0.9 % IV BOLUS
1000.0000 mL | Freq: Once | INTRAVENOUS | Status: AC
  Administered 2022-02-06: 1000 mL via INTRAVENOUS

## 2022-02-06 MED ORDER — ONDANSETRON 4 MG PO TBDP
4.0000 mg | ORAL_TABLET | Freq: Once | ORAL | Status: AC
  Administered 2022-02-06: 4 mg via ORAL

## 2022-02-06 MED ORDER — LOPERAMIDE HCL 2 MG PO TABS: 2.0000 mg | ORAL_TABLET | Freq: Four times a day (QID) | ORAL | 0 refills | Status: DC | PRN

## 2022-02-06 MED ORDER — ONDANSETRON HCL 4 MG/2ML IJ SOLN: 4.0000 mg | Freq: Once | INTRAMUSCULAR | Status: DC

## 2022-02-06 NOTE — ED Triage Notes (Signed)
PAtient presents to [facility] for evaluation of 4 days of nausea , vomoting and diarrhea.  Went to PCP this morning, they were unable to even draw any blood to do a work up.  Sent her here for fluids and evaluation.

## 2022-02-06 NOTE — Discharge Instructions (Addendum)
Please see the enclosed information regarding food choices to help relieve your diarrhea.  I provided you with prescriptions for Zofran for your nausea and loperamide for your diarrhea.  If your symptoms of diarrhea persist for more than 7 days, stool culture is warranted, please reach out to your primary care provider to have this done as we do not perform that here at urgent care.  The results of your complete blood count and metabolic panel should be available in the next 24 to 48 hours, initially they will be posted to your MyChart account and if they are abnormal, you will receive a phone call.  Please keep in mind that we are not open over the weekend so it may be Monday before you hear from [place].  I am glad you are feeling better after a liter of fluid and a little more Zofran.  I hope things continue to trend in this positive direction for you.  Please feel free to follow-up tomorrow if things have not significantly improved, we would happy to reevaluate you and treat you as needed.

## 2022-02-06 NOTE — ED Provider Notes (Signed)
[facility]    CSN: [id number] Arrival date & time: 02/06/22  1127    HISTORY   Chief Complaint  Patient presents with   Diarrhea   HPI Gina Fernandez is a 71 y.o. female. Patient has no significant medical history.  Patient presents to [facility] for evaluation of 4 days of nausea, vomiting and persistent diarrhea, states 2 episodes of diarrhea per day to count.  Patient denies blood or mucus in stool.  Patient denies foul smelling stool.  A few episodes of incontinence of stool, states that she seems to need to poop every time she stands up, states stool is nothing but liquid at this point.  Patient reports having a headache and feeling very tired right now, states she thinks that she may be dehydrated.  Patient states that yesterday she took a home COVID test which was negative.  Patient denies upper respiratory symptoms, cough.  Endorses body ache, chills and subjective fever.  States she went to see her PCP this morning for evaluation of these issues, states they were unable to obtain any blood for lab testing, adds that she was advised by her PCP who is a Designer, jewellery, to report to urgent care for lab testing and IV fluid replacement.  They did give her Zofran 4 mg IM which patient states has helped a little bit.      The history is provided by the patient.  Past Medical History:  Diagnosis Date   Anxiety    Depression    Fracture of L1 vertebra ([place])    Headache    has not had them since 30's   Hypertension    Osteoporosis 06/2019   T score -2.7 improved from prior DEXA on Prolia   Patient Active Problem List   Diagnosis Date Noted   Depression    Hypertension    Osteoporosis 05/28/2010   Past Surgical History:  Procedure Laterality Date   FOOT SURGERY  2010   LEFT FOOT   KYPHOPLASTY N/A 05/28/2017   Procedure: LUMBAR 1 KYPHOPLASTY;  Surgeon: Phylliss Bob, MD;  Location: [place];  Service: Orthopedics;  Laterality: N/A;  LUMBAR 1 KYPHOPLASTY; REQUEST 1  HOUR AND FLIP ROOM   NOSE SURGERY     TEE WITHOUT CARDIOVERSION N/A 09/25/2015   Procedure: TRANSESOPHAGEAL ECHOCARDIOGRAM (TEE);  Surgeon: Adrian Prows, MD;  [facility] ENDOSCOPY;  Service: Cardiovascular;  Laterality: N/A;   TONSILLECTOMY     OB History     Gravida  1   Para  1   Term      Preterm      AB  0   Living  1      SAB  0   IAB      Ectopic  0   Multiple      Live Births             Home Medications    Prior to Admission medications   Medication Sig Start Date End Date Taking? Authorizing Provider  acetaminophen (TYLENOL) 500 MG tablet Take 1,000 mg by mouth daily.    [provider]  ALPRAZolam Duanne Moron) 0.5 MG tablet Take 0.5 mg by mouth daily as needed for anxiety. For anxiety    [provider]  carvedilol (COREG) 6.25 MG tablet Take 12.5 mg by mouth daily. Takes 2 tablets at same time in the morning 05/05/16   [provider]  Cholecalciferol (VITAMIN D3) 2000 units TABS Take 4,000 Units by mouth daily.  [provider]  denosumab (PROLIA) 60 MG/ML SOLN injection Inject 60 mg into the skin every 6 (six) months. Administer in upper arm, thigh, or abdomen    [provider]  telmisartan (MICARDIS) 40 MG tablet Take 40 mg by mouth daily.    [provider]  triazolam (HALCION) 0.25 MG tablet Take 0.5-0.75 mg by mouth at bedtime. Depends on difficulty sleeping if takes 2-3 tablets    [provider]  venlafaxine XR (EFFEXOR-XR) 150 MG 24 hr capsule Take 300 mg by mouth daily.    [provider]    Family History Family History  Problem Relation Age of Onset   Hypertension Mother    Aneurysm Mother    Hypertension Father    Heart disease Father    Breast cancer Maternal Aunt 7   Cancer Sister        Bladder   Heart disease Brother    Heart failure Sister    Social History Social History   Tobacco Use   Smoking status: Never   Smokeless tobacco: Never  Vaping Use    Vaping Use: Never used  Substance Use Topics   Alcohol use: Yes    Alcohol/week: 8.0 standard drinks    Types: 3 Glasses of wine, 5 Standard drinks or equivalent per week    Comment: weekends mostly   Drug use: No   Allergies   Penicillins  Review of Systems Review of Systems Pertinent findings noted in history of present illness.   Physical Exam Triage Vital Signs ED Triage Vitals  Enc Vitals Group     BP 10/25/21 0827 (!) 147/82     Pulse Rate 10/25/21 0827 72     Resp 10/25/21 0827 18     Temp 10/25/21 0827 98.3 F (36.8 C)     Temp Source 10/25/21 0827 Oral     SpO2 10/25/21 0827 98 %     Weight --      Height --      Head Circumference --      Peak Flow --      Pain Score 10/25/21 0826 5     Pain Loc --      Pain Edu? --      Excl. in [place]? --   No data found.  Updated Vital Signs LMP 12/29/2004   Physical Exam Vitals and nursing note reviewed.  Constitutional:      General: She is not in acute distress.    Appearance: Normal appearance. She is not ill-appearing.  HENT:     Head: Normocephalic and atraumatic.     Salivary Glands: Right salivary gland is not diffusely enlarged or tender. Left salivary gland is not diffusely enlarged or tender.     Right Ear: Tympanic membrane, ear canal and external ear normal. No drainage. No middle ear effusion. There is no impacted cerumen. Tympanic membrane is not erythematous or bulging.     Left Ear: Tympanic membrane, ear canal and external ear normal. No drainage.  No middle ear effusion. There is no impacted cerumen. Tympanic membrane is not erythematous or bulging.     Nose: Nose normal. No nasal deformity, septal deviation, mucosal edema, congestion or rhinorrhea.     Right Turbinates: Not enlarged, swollen or pale.     Left Turbinates: Not enlarged, swollen or pale.     Right Sinus: No maxillary sinus tenderness or frontal sinus tenderness.     Left Sinus: No maxillary sinus tenderness or frontal sinus tenderness.  Mouth/Throat:     Lips: Pink. No lesions.     Mouth: Mucous membranes are moist. No oral lesions.     Pharynx: Oropharynx is clear. Uvula midline. No posterior oropharyngeal erythema or uvula swelling.     Tonsils: No tonsillar exudate. 0 on the right. 0 on the left.  Eyes:     General: Lids are normal.        Right eye: No discharge.        Left eye: No discharge.     Extraocular Movements: Extraocular movements intact.     Conjunctiva/sclera: Conjunctivae normal.     Right eye: Right conjunctiva is not injected.     Left eye: Left conjunctiva is not injected.  Neck:     Trachea: Trachea and phonation normal.  Cardiovascular:     Rate and Rhythm: Normal rate and regular rhythm.     Pulses: Normal pulses.     Heart sounds: Normal heart sounds. No murmur heard.   No friction rub. No gallop.  Pulmonary:     Effort: Pulmonary effort is normal. No accessory muscle usage, prolonged expiration or respiratory distress.     Breath sounds: Normal breath sounds. No stridor, decreased air movement or transmitted upper airway sounds. No decreased breath sounds, wheezing, rhonchi or rales.  Chest:     Chest wall: No tenderness.  Abdominal:     General: Abdomen is flat. Bowel sounds are increased. There is no distension.     Palpations: Abdomen is soft.     Tenderness: There is generalized abdominal tenderness. There is no right CVA tenderness or left CVA tenderness.     Hernia: No hernia is present.  Musculoskeletal:        General: Normal range of motion.     Cervical back: Normal range of motion and neck supple. Normal range of motion.  Lymphadenopathy:     Cervical: No cervical adenopathy.  Skin:    General: Skin is warm and dry.     Findings: No erythema or rash.  Neurological:     General: No focal deficit present.     Mental Status: She is alert and oriented to person, place, and time.  Psychiatric:        Mood and Affect: Mood normal.        Behavior: Behavior normal.     Visual Acuity Right Eye Distance:   Left Eye Distance:   Bilateral Distance:    Right Eye Near:   Left Eye Near:    Bilateral Near:     UC Couse / Diagnostics / Procedures:    EKG  Radiology No results found.  Procedures Procedures (including critical care time)  UC Diagnoses / Final Clinical Impressions(s)   I have reviewed the triage vital signs and the nursing notes.  Pertinent labs & imaging results that were available during my care of the patient were reviewed by me and considered in my medical decision making (see chart for details).    Final diagnoses:  Nausea vomiting and diarrhea   Patient brings her PCP visit discharge papers with her today, patient had normal blood pressure at that visit, blood pressure on arrival today is also normal.  Patient does appear unwell.    As a professional courtesy, IV insertion, a liter normal saline bolus and another 4 mg of Zofran to complete a therapeutic dose of 8 mg for her nausea was ordered.    Patient is provided with a prescription of loperamide 2 mg 4 times  daily as needed to slow her diarrhea, Zofran 8 mg 3 times daily as needed for nausea.    Patient advised to continue electrolyte replacement fluids such as Gatorade or Pedialyte until she is able to tolerate p.o. intake, avoid drinking water, foods to eat to resolve diarrhea information handout provided to patient.  Patient also advised to return to PCP if her diarrhea persists for more than 7 days as stool culture and testing for C. difficile would be strongly recommended.  Patient deemed stable for discharge to home.  ED Prescriptions     Medication Sig Dispense Auth. Provider   ondansetron (ZOFRAN-ODT) 8 MG disintegrating tablet Take 1 tablet (8 mg total) by mouth every 8 (eight) hours as needed for nausea or vomiting. 20 tablet Lynden Oxford Scales, PA-C   loperamide (IMODIUM A-D) 2 MG tablet Take 1 tablet (2 mg total) by mouth 4 (four) times daily as needed  for diarrhea or loose stools. 30 tablet Lynden Oxford Scales, PA-C      PDMP not reviewed this encounter.  Pending results:  Labs Reviewed  CBC WITH DIFFERENTIAL/PLATELET  COMPREHENSIVE METABOLIC PANEL    Medications Ordered in UC: Medications  sodium chloride 0.9 % bolus 1,000 mL (1,000 mLs Intravenous New Bag/Given 02/06/22 1357)  ondansetron (ZOFRAN-ODT) disintegrating tablet 4 mg (4 mg Oral Given 02/06/22 1357)    Disposition Upon Discharge:  Condition: stable for discharge home Home: take medications as prescribed; routine discharge instructions as discussed; follow up as advised.  Patient presented with an acute illness with associated systemic symptoms and significant discomfort requiring urgent management. In my opinion, this is a condition that a prudent lay person (someone who possesses an average knowledge of health and medicine) may potentially expect to result in complications if not addressed urgently such as respiratory distress, impairment of bodily function or dysfunction of bodily organs.   Routine symptom specific, illness specific and/or disease specific instructions were discussed with the patient and/or caregiver at length.   As such, the patient has been evaluated and assessed, work-up was performed and treatment was provided in alignment with urgent care protocols and evidence based medicine.  Patient/parent/caregiver has been advised that the patient may require follow up for further testing and treatment if the symptoms continue in spite of treatment, as clinically indicated and appropriate.  If the patient was tested for COVID-19, Influenza and/or RSV, then the patient/parent/guardian was advised to isolate at home pending the results of his/her diagnostic coronavirus test and potentially longer if theyre positive. I have also advised pt that if his/her COVID-19 test returns positive, it's recommended to self-isolate for at least 10 days after symptoms first  appeared AND until fever-free for 24 hours without fever reducer AND other symptoms have improved or resolved. Discussed self-isolation recommendations as well as instructions for household member/close contacts as per the [facility] and [place] DHHS, and also gave patient the [place] packet with this information.  Patient/parent/caregiver has been advised to return to the [facility] or PCP in 3-5 days if no better; to PCP or the Emergency Department if new signs and symptoms develop, or if the current signs or symptoms continue to change or worsen for further workup, evaluation and treatment as clinically indicated and appropriate  The patient will follow up with their current PCP if and as advised. If the patient does not currently have a PCP we will assist them in obtaining one.   The patient may need specialty follow up if the symptoms continue, in spite of conservative  treatment and management, for further workup, evaluation, consultation and treatment as clinically indicated and appropriate.   Patient/parent/caregiver verbalized understanding and agreement of plan as discussed.  All questions were addressed during visit.  Please see discharge instructions below for further details of plan.  Discharge Instructions:   Discharge Instructions      Please see the enclosed information regarding food choices to help relieve your diarrhea.  I provided you with prescriptions for Zofran for your nausea and loperamide for your diarrhea.  If your symptoms of diarrhea persist for more than 7 days, stool culture is warranted, please reach out to your primary care provider to have this done as we do not perform that here at urgent care.  The results of your complete blood count and metabolic panel should be available in the next 24 to 48 hours, initially they will be posted to your MyChart account and if they are abnormal, you will receive a phone call.  Please keep in mind that we are not open over the weekend so it may be  Monday before you hear from [place].  I am glad you are feeling better after a liter of fluid and a little more Zofran.  I hope things continue to trend in this positive direction for you.  Please feel free to follow-up tomorrow if things have not significantly improved, we would happy to reevaluate you and treat you as needed.      This office note has been dictated using Museum/gallery curator.  Unfortunately, and despite my best efforts, this method of dictation can sometimes lead to occasional typographical or grammatical errors.  I apologize in advance if this occurs.     Lynden Oxford Scales, [place] 02/06/22 [phone]

## 2022-02-08 LAB — COMPREHENSIVE METABOLIC PANEL
ALT: 58 IU/L — ABNORMAL HIGH (ref 0–32)
AST: 38 IU/L (ref 0–40)
Albumin/Globulin Ratio: 1.9 (ref 1.2–2.2)
Albumin: 4.2 g/dL (ref 3.7–4.7)
Alkaline Phosphatase: 100 IU/L (ref 44–121)
BUN/Creatinine Ratio: 24 (ref 12–28)
BUN: 18 mg/dL (ref 8–27)
Bilirubin Total: 0.2 mg/dL (ref 0.0–1.2)
CO2: 16 mmol/L — ABNORMAL LOW (ref 20–29)
Calcium: 7.9 mg/dL — ABNORMAL LOW (ref 8.7–10.3)
Chloride: 100 mmol/L (ref 96–106)
Creatinine, Ser: 0.74 mg/dL (ref 0.57–1.00)
Globulin, Total: 2.2 g/dL (ref 1.5–4.5)
Glucose: 96 mg/dL (ref 70–99)
Potassium: 4.1 mmol/L (ref 3.5–5.2)
Sodium: 136 mmol/L (ref 134–144)
Total Protein: 6.4 g/dL (ref 6.0–8.5)
eGFR: 85 mL/min/{1.73_m2} (ref >59)

## 2022-02-08 LAB — CBC WITH DIFFERENTIAL/PLATELET
Basophils Absolute: 0 10*3/uL (ref 0.0–0.2)
Basos: 0 %
EOS (ABSOLUTE): 0 10*3/uL (ref 0.0–0.4)
Eos: 0 %
Hematocrit: 41.9 % (ref 34.0–46.6)
Hemoglobin: 13.7 g/dL (ref 11.1–15.9)
Immature Grans (Abs): 0 10*3/uL (ref 0.0–0.1)
Immature Granulocytes: 0 %
Lymphocytes Absolute: 0.9 10*3/uL (ref 0.7–3.1)
Lymphs: 16 %
MCH: 32 pg (ref 26.6–33.0)
MCHC: 32.7 g/dL (ref 31.5–35.7)
MCV: 98 fL — ABNORMAL HIGH (ref 79–97)
Monocytes Absolute: 0.4 10*3/uL (ref 0.1–0.9)
Monocytes: 7 %
Neutrophils Absolute: 4.1 10*3/uL (ref 1.4–7.0)
Neutrophils: 77 %
Platelets: 198 10*3/uL (ref 150–450)
RBC: 4.28 x10E6/uL (ref 3.77–5.28)
RDW: 12.4 % (ref 11.7–15.4)
WBC: 5.4 10*3/uL (ref 3.4–10.8)

## 2022-02-20 ENCOUNTER — Other Ambulatory Visit: Payer: Self-pay

## 2022-02-20 ENCOUNTER — Encounter (HOSPITAL_BASED_OUTPATIENT_CLINIC_OR_DEPARTMENT_OTHER): Payer: Self-pay | Admitting: Otolaryngology

## 2022-02-20 NOTE — Progress Notes (Signed)
Reviewed patients chart with Dr. Ola Spurr regarding patients ECHO results. Okay to proceed with surgery at [facility] per Dr. Ola Spurr.

## 2022-02-24 ENCOUNTER — Encounter (HOSPITAL_BASED_OUTPATIENT_CLINIC_OR_DEPARTMENT_OTHER)
Admission: RE | Admit: 2022-02-24 | Discharge: 2022-02-24 | Disposition: A | Discharge status: Home / Self Care | Payer: Medicare PPO | Source: Ambulatory Visit | Attending: Otolaryngology | Admitting: Otolaryngology

## 2022-02-24 DIAGNOSIS — Z01810 Encounter for preprocedural cardiovascular examination: Secondary | ICD-10-CM | POA: Diagnosis not present

## 2022-02-24 DIAGNOSIS — F3341 Major depressive disorder, recurrent, in partial remission: Secondary | ICD-10-CM | POA: Diagnosis not present

## 2022-03-03 ENCOUNTER — Encounter (HOSPITAL_BASED_OUTPATIENT_CLINIC_OR_DEPARTMENT_OTHER)
Admission: RE | Disposition: A | Discharge status: Home / Self Care | Payer: Self-pay | Source: Home / Self Care | Attending: Otolaryngology

## 2022-03-03 ENCOUNTER — Encounter (HOSPITAL_BASED_OUTPATIENT_CLINIC_OR_DEPARTMENT_OTHER): Payer: Self-pay | Admitting: Otolaryngology

## 2022-03-03 ENCOUNTER — Ambulatory Visit (HOSPITAL_BASED_OUTPATIENT_CLINIC_OR_DEPARTMENT_OTHER): Payer: Medicare PPO | Admitting: Anesthesiology

## 2022-03-03 ENCOUNTER — Other Ambulatory Visit: Payer: Self-pay

## 2022-03-03 ENCOUNTER — Ambulatory Visit (HOSPITAL_BASED_OUTPATIENT_CLINIC_OR_DEPARTMENT_OTHER)
Admission: RE | Admit: 2022-03-03 | Discharge: 2022-03-04 | Disposition: A | Discharge status: Home / Self Care | Payer: Medicare PPO | Attending: Otolaryngology | Admitting: Otolaryngology

## 2022-03-03 DIAGNOSIS — D110 Benign neoplasm of parotid gland: Secondary | ICD-10-CM | POA: Insufficient documentation

## 2022-03-03 DIAGNOSIS — D37030 Neoplasm of uncertain behavior of the parotid salivary glands: Secondary | ICD-10-CM | POA: Diagnosis not present

## 2022-03-03 DIAGNOSIS — H9202 Otalgia, left ear: Secondary | ICD-10-CM | POA: Diagnosis not present

## 2022-03-03 DIAGNOSIS — I10 Essential (primary) hypertension: Secondary | ICD-10-CM | POA: Insufficient documentation

## 2022-03-03 DIAGNOSIS — K118 Other diseases of salivary glands: Secondary | ICD-10-CM | POA: Diagnosis not present

## 2022-03-03 DIAGNOSIS — Z9049 Acquired absence of other specified parts of digestive tract: Secondary | ICD-10-CM

## 2022-03-03 DIAGNOSIS — D490 Neoplasm of unspecified behavior of digestive system: Secondary | ICD-10-CM | POA: Diagnosis present

## 2022-03-03 DIAGNOSIS — F32A Depression, unspecified: Secondary | ICD-10-CM | POA: Insufficient documentation

## 2022-03-03 DIAGNOSIS — F419 Anxiety disorder, unspecified: Secondary | ICD-10-CM | POA: Diagnosis not present

## 2022-03-03 HISTORY — PX: PAROTIDECTOMY: SHX2163

## 2022-03-03 HISTORY — DX: Dislocation of jaw, unspecified side, initial encounter: S03.00XA

## 2022-03-03 HISTORY — DX: Hyperlipidemia, unspecified: E78.5

## 2022-03-03 SURGERY — EXCISION, PAROTID GLAND
Anesthesia: General | Site: Face | Laterality: Left

## 2022-03-03 MED ORDER — KCL IN DEXTROSE-NACL 20-5-0.45 MEQ/L-%-% IV SOLN
INTRAVENOUS | Status: DC
  Filled 2022-03-03: qty 1000

## 2022-03-03 MED ORDER — PHENYLEPHRINE 40 MCG/ML (10ML) SYRINGE FOR IV PUSH (FOR BLOOD PRESSURE SUPPORT)
PREFILLED_SYRINGE | INTRAVENOUS | Status: AC
  Filled 2022-03-03: qty 10

## 2022-03-03 MED ORDER — FENTANYL CITRATE (PF) 100 MCG/2ML IJ SOLN
INTRAMUSCULAR | Status: AC
  Filled 2022-03-03: qty 2

## 2022-03-03 MED ORDER — LACTATED RINGERS IV SOLN: INTRAVENOUS | Status: DC

## 2022-03-03 MED ORDER — EPHEDRINE SULFATE (PRESSORS) 50 MG/ML IJ SOLN
INTRAMUSCULAR | Status: DC | PRN
Start: 2022-03-03 — End: 2022-03-03
  Administered 2022-03-03: 10 mg via INTRAVENOUS

## 2022-03-03 MED ORDER — CLINDAMYCIN PHOSPHATE 900 MG/50ML IV SOLN
INTRAVENOUS | Status: DC | PRN
  Administered 2022-03-03: 900 mg via INTRAVENOUS

## 2022-03-03 MED ORDER — LIDOCAINE HCL (CARDIAC) PF 100 MG/5ML IV SOSY
PREFILLED_SYRINGE | INTRAVENOUS | Status: DC | PRN
Start: 2022-03-03 — End: 2022-03-03
  Administered 2022-03-03: 40 mg via INTRAVENOUS

## 2022-03-03 MED ORDER — FENTANYL CITRATE (PF) 100 MCG/2ML IJ SOLN
INTRAMUSCULAR | Status: DC | PRN
  Administered 2022-03-03: 25 ug via INTRAVENOUS
  Administered 2022-03-03: 50 ug via INTRAVENOUS
  Administered 2022-03-03: 25 ug via INTRAVENOUS
  Administered 2022-03-03: 100 ug via INTRAVENOUS

## 2022-03-03 MED ORDER — SUCCINYLCHOLINE CHLORIDE 200 MG/10ML IV SOSY
PREFILLED_SYRINGE | INTRAVENOUS | Status: DC | PRN
Start: 2022-03-03 — End: 2022-03-03
  Administered 2022-03-03: 120 mg via INTRAVENOUS

## 2022-03-03 MED ORDER — GLYCOPYRROLATE 0.2 MG/ML IJ SOLN
INTRAMUSCULAR | Status: DC | PRN
  Administered 2022-03-03: .2 mg via INTRAVENOUS

## 2022-03-03 MED ORDER — CLINDAMYCIN PHOSPHATE 900 MG/50ML IV SOLN
INTRAVENOUS | Status: AC
  Filled 2022-03-03: qty 50

## 2022-03-03 MED ORDER — ZOLPIDEM TARTRATE 5 MG PO TABS
5.0000 mg | ORAL_TABLET | Freq: Every evening | ORAL | Status: DC | PRN
  Administered 2022-03-03: 5 mg via ORAL
  Filled 2022-03-03: qty 1

## 2022-03-03 MED ORDER — ROCURONIUM BROMIDE 100 MG/10ML IV SOLN
INTRAVENOUS | Status: DC | PRN
  Administered 2022-03-03: 10 mg via INTRAVENOUS

## 2022-03-03 MED ORDER — PHENYLEPHRINE HCL (PRESSORS) 10 MG/ML IV SOLN
INTRAVENOUS | Status: AC
  Filled 2022-03-03: qty 1

## 2022-03-03 MED ORDER — ACETAMINOPHEN 500 MG PO TABS
ORAL_TABLET | ORAL | Status: AC
  Filled 2022-03-03: qty 2

## 2022-03-03 MED ORDER — PROPOFOL 10 MG/ML IV BOLUS
INTRAVENOUS | Status: DC | PRN
  Administered 2022-03-03: 50 mg via INTRAVENOUS
  Administered 2022-03-03: 100 mg via INTRAVENOUS
  Administered 2022-03-03: 50 mg via INTRAVENOUS

## 2022-03-03 MED ORDER — NEOSTIGMINE METHYLSULFATE 3 MG/3ML IV SOSY
PREFILLED_SYRINGE | INTRAVENOUS | Status: AC
  Filled 2022-03-03: qty 3

## 2022-03-03 MED ORDER — ACETAMINOPHEN 500 MG PO TABS
1000.0000 mg | ORAL_TABLET | Freq: Once | ORAL | Status: AC
  Administered 2022-03-03: 1000 mg via ORAL

## 2022-03-03 MED ORDER — DEXAMETHASONE SODIUM PHOSPHATE 4 MG/ML IJ SOLN
INTRAMUSCULAR | Status: DC | PRN
  Administered 2022-03-03: 6 mg via INTRAVENOUS

## 2022-03-03 MED ORDER — GLYCOPYRROLATE PF 0.2 MG/ML IJ SOSY
PREFILLED_SYRINGE | INTRAMUSCULAR | Status: AC
  Filled 2022-03-03: qty 1

## 2022-03-03 MED ORDER — LIDOCAINE-EPINEPHRINE 1 %-1:100000 IJ SOLN
INTRAMUSCULAR | Status: DC | PRN
  Administered 2022-03-03: 4.5 mL

## 2022-03-03 MED ORDER — OXYCODONE-ACETAMINOPHEN 5-325 MG PO TABS
1.0000 | ORAL_TABLET | ORAL | Status: DC | PRN
  Administered 2022-03-03: 2 via ORAL
  Administered 2022-03-03 – 2022-03-04 (×3): 1 via ORAL
  Filled 2022-03-03 (×3): qty 1
  Filled 2022-03-03: qty 2

## 2022-03-03 MED ORDER — ONDANSETRON HCL 4 MG/2ML IJ SOLN
INTRAMUSCULAR | Status: DC | PRN
  Administered 2022-03-03: 4 mg via INTRAVENOUS

## 2022-03-03 MED ORDER — PHENYLEPHRINE HCL-NACL 20-0.9 MG/250ML-% IV SOLN
INTRAVENOUS | Status: DC | PRN
  Administered 2022-03-03: 75 ug/min via INTRAVENOUS

## 2022-03-03 MED ORDER — MORPHINE SULFATE (PF) 4 MG/ML IV SOLN: 2.0000 mg | INTRAVENOUS | Status: DC | PRN

## 2022-03-03 MED ORDER — ONDANSETRON HCL 4 MG PO TABS: 4.0000 mg | ORAL_TABLET | ORAL | Status: DC | PRN

## 2022-03-03 MED ORDER — ONDANSETRON HCL 4 MG/2ML IJ SOLN: 4.0000 mg | INTRAMUSCULAR | Status: DC | PRN

## 2022-03-03 MED ORDER — FENTANYL CITRATE (PF) 100 MCG/2ML IJ SOLN
25.0000 ug | INTRAMUSCULAR | Status: DC | PRN
  Administered 2022-03-03 (×2): 50 ug via INTRAVENOUS

## 2022-03-03 MED ORDER — PROPOFOL 10 MG/ML IV BOLUS
INTRAVENOUS | Status: AC
  Filled 2022-03-03: qty 20

## 2022-03-03 MED ORDER — PHENYLEPHRINE HCL (PRESSORS) 10 MG/ML IV SOLN
INTRAVENOUS | Status: DC | PRN
  Administered 2022-03-03: 160 ug via INTRAVENOUS
  Administered 2022-03-03: 200 ug via INTRAVENOUS
  Administered 2022-03-03: 120 ug via INTRAVENOUS
  Administered 2022-03-03: 80 ug via INTRAVENOUS
  Administered 2022-03-03: 200 ug via INTRAVENOUS
  Administered 2022-03-03: 40 ug via INTRAVENOUS

## 2022-03-03 MED ORDER — NEOSTIGMINE METHYLSULFATE 10 MG/10ML IV SOLN
INTRAVENOUS | Status: DC | PRN
  Administered 2022-03-03: 1 mg via INTRAVENOUS

## 2022-03-03 SURGICAL SUPPLY — 61 items
ADH SKN CLS APL DERMABOND .7 (GAUZE/BANDAGES/DRESSINGS) ×1
APL SRG 3 HI ABS STRL LF PLS (MISCELLANEOUS) ×1
APPLICATOR DR MATTHEWS STRL (MISCELLANEOUS) ×3 IMPLANT
ATTRACTOMAT 16X20 MAGNETIC DRP (DRAPES) IMPLANT
BALL CTTN LRG ABS STRL LF (GAUZE/BANDAGES/DRESSINGS) ×1
BLADE SURG 15 STRL LF DISP TIS (BLADE) ×2 IMPLANT
BLADE SURG 15 STRL SS (BLADE) ×2
CANISTER SUCT 1200ML W/VALVE (MISCELLANEOUS) ×3 IMPLANT
CORD BIPOLAR FORCEPS 12FT (ELECTRODE) ×3 IMPLANT
COTTONBALL LRG STERILE PKG (GAUZE/BANDAGES/DRESSINGS) ×3 IMPLANT
COVER BACK TABLE 60X90IN (DRAPES) ×3 IMPLANT
COVER MAYO STAND STRL (DRAPES) ×3 IMPLANT
DERMABOND ADVANCED (GAUZE/BANDAGES/DRESSINGS) ×1
DERMABOND ADVANCED .7 DNX12 (GAUZE/BANDAGES/DRESSINGS) ×2 IMPLANT
DRAIN CHANNEL 10F 3/8 F FF (DRAIN) ×1 IMPLANT
DRAPE SURG 17X23 STRL (DRAPES) IMPLANT
DRAPE U-SHAPE 76X120 STRL (DRAPES) ×3 IMPLANT
ELECT COATED BLADE 2.86 ST (ELECTRODE) ×3 IMPLANT
ELECT PAIRED SUBDERMAL (MISCELLANEOUS) ×2
ELECT REM PT RETURN 9FT ADLT (ELECTROSURGICAL) ×2
ELECTRODE PAIRED SUBDERMAL (MISCELLANEOUS) ×2 IMPLANT
ELECTRODE REM PT RTRN 9FT ADLT (ELECTROSURGICAL) ×2 IMPLANT
EVACUATOR SILICONE 100CC (DRAIN) ×1 IMPLANT
FORCEPS BIPOLAR SPETZLER 8 1.0 (NEUROSURGERY SUPPLIES) ×3 IMPLANT
GAUZE 4X4 16PLY ~~LOC~~+RFID DBL (SPONGE) ×3 IMPLANT
GLOVE SURG ENC MOIS LTX SZ6.5 (GLOVE) IMPLANT
GLOVE SURG ENC MOIS LTX SZ7.5 (GLOVE) ×3 IMPLANT
GLOVE SURG POLYISO LF SZ6.5 (GLOVE) ×1 IMPLANT
GLOVE SURG POLYISO LF SZ7 (GLOVE) ×3 IMPLANT
GLOVE SURG UNDER POLY LF SZ7 (GLOVE) ×4 IMPLANT
GOWN STRL REUS W/ TWL LRG LVL3 (GOWN DISPOSABLE) ×4 IMPLANT
GOWN STRL REUS W/TWL LRG LVL3 (GOWN DISPOSABLE) ×8
LOCATOR NERVE 3 VOLT (DISPOSABLE) IMPLANT
NDL HYPO 25X1 1.5 SAFETY (NEEDLE) ×2 IMPLANT
NEEDLE HYPO 25X1 1.5 SAFETY (NEEDLE) ×2 IMPLANT
NS IRRIG 1000ML POUR BTL (IV SOLUTION) ×3 IMPLANT
PACK BASIN DAY SURGERY FS (CUSTOM PROCEDURE TRAY) ×3 IMPLANT
PAD ALCOHOL SWAB (MISCELLANEOUS) ×6 IMPLANT
PENCIL SMOKE EVACUATOR (MISCELLANEOUS) ×3 IMPLANT
PIN SAFETY STERILE (MISCELLANEOUS) IMPLANT
PROBE NERVBE PRASS .33 (MISCELLANEOUS) ×3 IMPLANT
SHEARS HARMONIC 9CM CVD (BLADE) ×4 IMPLANT
SLEEVE SCD COMPRESS KNEE MED (STOCKING) ×3 IMPLANT
SPIKE FLUID TRANSFER (MISCELLANEOUS) ×2 IMPLANT
SPONGE INTESTINAL PEANUT (DISPOSABLE) ×5 IMPLANT
STAPLER VISISTAT 35W (STAPLE) IMPLANT
SUT ETHILON 3 0 PS 1 (SUTURE) ×1 IMPLANT
SUT PROLENE 5 0 PS 2 (SUTURE) ×3 IMPLANT
SUT SILK 2 0 PERMA HAND 18 BK (SUTURE) ×9 IMPLANT
SUT SILK 2 0 TIES 17X18 (SUTURE)
SUT SILK 2-0 18XBRD TIE BLK (SUTURE) IMPLANT
SUT SILK 3 0 TIES 17X18 (SUTURE) ×2
SUT SILK 3-0 18XBRD TIE BLK (SUTURE) ×2 IMPLANT
SUT SILK 4 0 TIES 17X18 (SUTURE) IMPLANT
SUT VIC AB 3-0 FS2 27 (SUTURE) IMPLANT
SUT VICRYL 4-0 PS2 18IN ABS (SUTURE) ×3 IMPLANT
SYR BULB EAR ULCER 3OZ GRN STR (SYRINGE) ×3 IMPLANT
SYR CONTROL 10ML LL (SYRINGE) ×3 IMPLANT
TOWEL GREEN STERILE FF (TOWEL DISPOSABLE) ×3 IMPLANT
TRAY DSU PREP LF (CUSTOM PROCEDURE TRAY) ×3 IMPLANT
TUBE CONNECTING 20X1/4 (TUBING) ×3 IMPLANT

## 2022-03-03 NOTE — H&P (Signed)
Cc: Left parotid mass ? ?HPI: ?The patient is a 71 year old female who presents today complaining of a left neck mass.  The patient first noted the neck mass 2 months ago.  It has gradually increased in size.  It has resulted in occasional left ear pain.  The patient denies any facial weakness or numbness.  She is tolerating oral intake well.  She denies any dysphagia, odynophagia, or dyspnea.  She was previously evaluated with a neck ultrasound and neck CT scan.  Her CT scan showed an approximately 1.5 cm mass within the deep lobe of the left parotid gland.  She has no previous ENT surgery. ? ?The patient's review of systems (constitutional, eyes, ENT, cardiovascular, respiratory, GI, musculoskeletal, skin, neurologic, psychiatric, endocrine, hematologic, allergic) is noted in the ROS questionnaire.  It is reviewed with the patient. ? ?Major events: None.  ?Ongoing medical problems: Hypertension, osteoporosis, depression.  ?Family health history: Heart disease.  ?Social history: The patient is single.  She denies the use of tobacco or illegal drugs. She drinks wine on occasions.   ? ?Exam: ?General: Communicates without difficulty, well nourished, no acute distress. Head: Normocephalic, no evidence injury, no tenderness, facial buttresses intact without stepoff. Face/sinus: No tenderness to palpation and percussion. Facial movement is normal and symmetric. Eyes: PERRL, EOMI. No scleral icterus, conjunctivae clear. Neuro: CN II exam reveals vision grossly intact.  No nystagmus at any point of gaze. Ears: Auricles well formed without lesions.  Ear canals are intact without mass or lesion.  No erythema or edema is appreciated.  The TMs are intact without fluid. Nose: External evaluation reveals normal support and skin without lesions.  Dorsum is intact.  Anterior rhinoscopy reveals pink mucosa over anterior aspect of inferior turbinates and intact septum.  No purulence noted. Oral:  Oral cavity and oropharynx are  intact, symmetric, without erythema or edema.  Mucosa is moist without lesions. Neck: Full range of motion without pain.  There is no significant lymphadenopathy.   Thyroid bed within normal limits to palpation.  A mass is palpable within the deep lobe of the left parotid gland.  Trachea is midline.  ? ?Assessment  ?1.  The patient has an approximately 1.5 cm soft tissue mass within the deep lobe of the left parotid gland.  The size of the mass has gradually increased over the past 2 months. ?2.  The patient currently has no facial weakness, numbness, or other head and neck symptoms. ? ?Plan  ?1.  The physical exam findings and the CT results are reviewed with the patient.  I personally reviewed the patient's CT images. ?2.  The treatment options are discussed.  The options include conservative observation, fine-needle aspiration biopsy, or surgical excision of the mass.  The risk, benefits, alternatives, and details of the treatment options are extensively discussed. ?3.  The surgical resection procedure well likely involve left total parotidectomy with facial nerve dissection and preservation. ?4.  The patient would like to proceed with the surgical resection procedure. ? ?

## 2022-03-03 NOTE — Transfer of Care (Signed)
Immediate Anesthesia Transfer of Care Note ? ?Patient: Gina Fernandez ? ?Procedure(s) Performed: TOTAL PAROTIDECTOMY (Left: Face) ? ?Patient Location: PACU ? ?Anesthesia Type:General ? ?Level of Consciousness: drowsy ? ?Airway & Oxygen Therapy: Patient Spontanous Breathing and Patient connected to face mask oxygen ? ?Post-op Assessment: Report given to RN and Post -op Vital signs reviewed and stable ? ?Post vital signs: Reviewed and stable ? ?Last Vitals:  ?Vitals Value Taken Time  ?BP 119/72 03/03/22 1053  ?Temp    ?Pulse 92 03/03/22 1055  ?Resp 16 03/03/22 1055  ?SpO2 88 % 03/03/22 1055  ?Vitals shown include unvalidated device data. ? ?Last Pain:  ?Vitals:  ? 03/03/22 0702  ?TempSrc: Oral  ?PainSc: 0-No pain  ?   ? ?Patients Stated Pain Goal: 3 (03/03/22 0086) ? ?Complications: No notable events documented. ?

## 2022-03-03 NOTE — Anesthesia Preprocedure Evaluation (Addendum)
Anesthesia Evaluation  ?Patient identified by MRN, date of birth, ID band ?Patient awake ? ? ? ?Reviewed: ?Allergy & Precautions, NPO status , Patient's Chart, lab work & pertinent test results, reviewed documented beta blocker date and time  ? ?Airway ?Mallampati: I ? ?TM Distance: >3 FB ?Neck ROM: Full ? ? ? Dental ? ?(+) Dental Advisory Given, Chipped,  ?  ?Pulmonary ?neg pulmonary ROS,  ?  ?Pulmonary exam normal ?breath sounds clear to auscultation ? ? ? ? ? ? Cardiovascular ?hypertension, Pt. on home beta blockers and Pt. on medications ?Normal cardiovascular exam ?Rhythm:Regular Rate:Normal ? ? ?  ?Neuro/Psych ? Headaches, PSYCHIATRIC DISORDERS Anxiety Depression   ? GI/Hepatic ?negative GI ROS, Neg liver ROS,   ?Endo/Other  ?negative endocrine ROS ? Renal/GU ?negative Renal ROS  ?negative genitourinary ?  ?Musculoskeletal ?negative musculoskeletal ROS ?(+)  ? Abdominal ?  ?Peds ? Hematology ?negative hematology ROS ?(+)   ?Anesthesia Other Findings ? ? Reproductive/Obstetrics ? ?  ? ? ? ? ? ? ? ? ? ? ? ? ? ?  ?  ? ? ? ? ? ? ? ?Anesthesia Physical ?Anesthesia Plan ? ?ASA: 2 ? ?Anesthesia Plan: General  ? ?Post-op Pain Management: Tylenol PO (pre-op)*  ? ?Induction: Intravenous ? ?PONV Risk Score and Plan: 3 and Midazolam, Dexamethasone and Ondansetron ? ?Airway Management Planned: Oral ETT ? ?Additional Equipment:  ? ?Intra-op Plan:  ? ?Post-operative Plan: Extubation in OR ? ?Informed Consent: I have reviewed the patients History and Physical, chart, labs and discussed the procedure including the risks, benefits and alternatives for the proposed anesthesia with the patient or authorized representative who has indicated his/her understanding and acceptance.  ? ? ? ?Dental advisory given ? ?Plan Discussed with: CRNA ? ?Anesthesia Plan Comments:   ? ? ? ? ? ? ?Anesthesia Quick Evaluation ? ?

## 2022-03-03 NOTE — Anesthesia Procedure Notes (Signed)
Procedure Name: Intubation ?Date/Time: 03/03/2022 8:26 AM ?Performed by: Ezequiel Kayser, CRNA ?Pre-anesthesia Checklist: Patient identified, Emergency Drugs available, Suction available and Patient being monitored ?Patient Re-evaluated:Patient Re-evaluated prior to induction ?Oxygen Delivery Method: Circle System Utilized ?Preoxygenation: Pre-oxygenation with 100% oxygen ?Induction Type: IV induction ?Ventilation: Mask ventilation without difficulty ?Laryngoscope Size: Mac and 3 ?Grade View: Grade I ?Tube type: Oral ?Tube size: 7.0 mm ?Number of attempts: 1 ?Airway Equipment and Method: Stylet and Oral airway ?Placement Confirmation: ETT inserted through vocal cords under direct vision, positive ETCO2 and breath sounds checked- equal and bilateral ?Secured at: 21 cm ?Tube secured with: Tape ?Dental Injury: Teeth and Oropharynx as per pre-operative assessment  ? ? ? ? ?

## 2022-03-03 NOTE — Anesthesia Postprocedure Evaluation (Signed)
Anesthesia Post Note ? ?Patient: Gina Fernandez ? ?Procedure(s) Performed: TOTAL PAROTIDECTOMY (Left: Face) ? ?  ? ?Patient location during evaluation: PACU ?Anesthesia Type: General ?Level of consciousness: awake and alert ?Pain management: pain level controlled ?Vital Signs Assessment: post-procedure vital signs reviewed and stable ?Respiratory status: spontaneous breathing, nonlabored ventilation, respiratory function stable and patient connected to nasal cannula oxygen ?Cardiovascular status: blood pressure returned to baseline and stable ?Postop Assessment: no apparent nausea or vomiting ?Anesthetic complications: no ? ? ?No notable events documented. ? ?Last Vitals:  ?Vitals:  ? 03/03/22 1130 03/03/22 1230  ?BP: 121/85 114/72  ?Pulse: 83 88  ?Resp: 16 18  ?Temp:  37.1 ?C  ?SpO2: 95% 95%  ?  ?Last Pain:  ?Vitals:  ? 03/03/22 1230  ?TempSrc:   ?PainSc: 7   ? ? ?  ?  ?  ?  ?  ?  ? ?[name] ? ? ? ? ?

## 2022-03-03 NOTE — Discharge Instructions (Signed)
Parotidectomy, Care After ?This sheet gives you information about how to care for yourself after your procedure. Your health care provider may also give you more specific instructions. If you have problems or questions, contact your health care provider. ?What can I expect after the procedure? ?After the procedure, it is common to have: ?Pain and mild swelling at the incision site. ?Numbness along the incision. ?Numbness in part or all of your ear. ?Mild jaw discomfort on the surgical side when you are eating or chewing. This may last up to 2-4 weeks. ?Follow these instructions at home: ?Medicines ?Take over-the-counter and prescription medicines only as told by your health care provider. ?If you were prescribed an antibiotic medicine, take it as told by your health care provider. Do not stop taking the antibiotic even if you start to feel better. ?Incision care ?Follow instructions from your health care provider about how to take care of your incision. Make sure you: ?Leave stitches (sutures), skin glue, or adhesive strips in place. These skin closures may need to be in place for 2 weeks or longer. If adhesive strip edges start to loosen and curl up, you may trim the loose edges. Do not remove adhesive strips completely unless your health care provider tells you to do that. ?Check your incision area every day for signs of infection. Check for: ?More redness, swelling, or pain. ?Fluid or blood. ?Warmth. ?Pus or a bad smell. ?Follow your health care provider's instructions about cleaning and maintaining the drain that was placed near your incision. ?Eating and drinking ?Follow instructions from your health care provider about eating or drinking restrictions. ?If your mouth or jaw is sore, try eating soft foods until you feel better. ?Activity ?Return to your normal activities as told by your health care provider. Ask your health care provider what activities are safe for you. ?Rest as told by your health care  provider. ?Avoid sitting for a long time without moving. Get up to take short walks every 1-2 hours. This is important to improve blood flow and breathing. Ask for help if you feel weak or unsteady. ?Do not lift anything that is heavier than 10 lb (4.5 kg), or the limit that you are told, until your health care provider says that it is safe. ?General instructions ?Keep your head raised (elevated) when you lie down during the first few weeks after surgery. This will help prevent increased swelling. ?Keep all follow-up visits as told by your health care provider. This is important. ?Contact a health care provider if: ?You have pain that does not get better with medicine. ?You have more redness, swelling, or pain around your incision. ?You have fluid or blood coming from your incision. ?Your incision feels warm to the touch. ?You have pus or a bad smell coming from your incision. ?You vomit or feel nauseous. ?You have a fever. ?Get help right away if: ?You have more pain, swelling, or redness that suddenly gets worse at the incision site. ?You have increasing numbness or weakness in your face. ?You have severe pain. ?Summary ?After the procedure, it is common to have mild jaw discomfort on the surgical side when you are eating or chewing. This may last up to 2-4 weeks. ?Follow instructions from your health care provider about how to take care of your incision. ?If your mouth or jaw is sore, try eating soft foods until you feel better. ?Return to your normal activities as told by your health care provider. Ask your health care provider what activities  are safe for you. ?This information is not intended to replace advice given to you by your health care provider. Make sure you discuss any questions you have with your health care provider. ?Document Revised: 10/04/2018 Document Reviewed: 10/06/2018 ?Elsevier Patient Education ? [place]. ?

## 2022-03-03 NOTE — Op Note (Signed)
DATE OF PROCEDURE:  03/03/2022 ? ?                            OPERATIVE REPORT ? ?SURGEON:  Leta Baptist, MD ? ?PREOPERATIVE DIAGNOSES: ?1.  Left deep lobe parotid mass ?  ?POSTOPERATIVE DIAGNOSES: ?1.  Left deep lobe parotid mass ?  ?PROCEDURE PERFORMED: Left total parotidectomy ?  ?ANESTHESIA:  General endotracheal tube anesthesia. ?  ?COMPLICATIONS:  None. ?  ?ESTIMATED BLOOD LOSS: 20 ml ?  ?INDICATION FOR PROCEDURE: Gina Fernandez is a 71 y.o. female with a history of an enlarging left upper neck mass.  It has resulted in occasional left ear pain.  She subsequently underwent a neck CT scan.  The CT showed a 1.5 cm left deep lobe parotid mass. Based on the above findings, the decision was made for the patient to undergo the above stated procedure. Likelihood of success in reducing symptoms was also discussed.  The risks, benefits, alternatives, and details of the procedure were discussed with the patient.  Questions were invited and answered.  Informed consent was obtained. ?  ?DESCRIPTION:  The patient was taken to the operating room and placed supine on the operating table.  General endotracheal tube anesthesia was administered by the anesthesiologist.  Facial nerve monitoring electrodes were placed.  The facial nerve monitoring system was functional throughout the case. ?  ?The patient was positioned and prepped and draped in the standard fashion for left parotid surgery.  1% lidocaine with 1 100,000 epinephrine was infiltrated at the planned site of incision.  A standard facelift incision was made.  The left superficial SMAS skin flap was elevated in the standard fashion. ? ?Careful dissection was performed along the left preauricular plane.  The dissection was carried down to the level of the facial nerve.  The main trunk of the facial nerve and the branches of the facial nerve were carefully dissected free.  All branches were noted to be functional throughout the case.  At this time, a nearly 2 cm soft tissue mass  was noted within the deep lobe of the parotid gland, beneath the facial nerve.  The facial nerve was mobilized, in order to allow access to the deep parotid lobe.  The entire parotid mass was then carefully dissected free from the surrounding soft tissue.  The mass was sent to the pathology department for permanent histologic identification. ? ?The surgical site was copiously irrigated. A #10 JP drain was placed. The incision was closed in layers with 4-0 Vicryl and Dermabond. ?  ?The care of the patient was turned over to the anesthesiologist.  The patient was awakened from anesthesia without difficulty.  The patient was extubated and transferred to the recovery room in good condition. ?  ?OPERATIVE FINDINGS:  A nearly 2 cm mass was noted within the deep left parotid lobe. ?  ?SPECIMEN: Left deep lobe parotid mass. ?  ?FOLLOWUP CARE:  The patient will be admitted for overnight observation. ? ?Laylaa Guevarra W Tyshawn Ciullo ?03/03/2022 11:02 AM  ?

## 2022-03-04 ENCOUNTER — Encounter (HOSPITAL_BASED_OUTPATIENT_CLINIC_OR_DEPARTMENT_OTHER): Payer: Self-pay | Admitting: Otolaryngology

## 2022-03-04 DIAGNOSIS — D110 Benign neoplasm of parotid gland: Secondary | ICD-10-CM | POA: Diagnosis not present

## 2022-03-04 MED ORDER — OXYCODONE-ACETAMINOPHEN 5-325 MG PO TABS: 1.0000 | ORAL_TABLET | ORAL | 0 refills | Status: AC | PRN

## 2022-03-04 MED ORDER — CLINDAMYCIN HCL 300 MG PO CAPS: 300.0000 mg | ORAL_CAPSULE | Freq: Three times a day (TID) | ORAL | 0 refills | Status: AC

## 2022-03-04 NOTE — Discharge Summary (Signed)
Physician Discharge Summary  ?Patient ID: ?Gina Fernandez ?MRN: [id number] ?DOB/AGE: [birth year] 71 y.o. ? ?Admit date: 03/03/2022 ?Discharge date: 03/04/2022 ? ?Admission Diagnoses: Left parotid mass ? ?Discharge Diagnoses: Left parotid mass ?Principal Problem: ?  H/O parotidectomy ? ? ?Discharged Condition: good ? ?Hospital Course: Pt had an uneventful overnight stay. Pt tolerated po well. No bleeding. No stridor. Facial nerve function intact. ? ?Consults: None ? ?Significant Diagnostic Studies: None ? ?Treatments: surgery: Left total parotidectomy ? ?Discharge Exam: ?Blood pressure 139/68, pulse 67, temperature 98.4 ?F (36.9 ?C), resp. rate 16, height '5\' 2"'$  (1.575 m), weight 59.6 kg, last menstrual period 12/29/2004, SpO2 94 %. ?Incision: c/d/I ?CN 7 intact. ? ?Disposition: Discharge disposition: 01-Home or Self Care ? ? ? ? ? ? ?Discharge Instructions   ? ? Activity as tolerated - No restrictions   Complete by: As directed ?  ? Diet general   Complete by: As directed ?  ? No wound care   Complete by: As directed ?  ? ?  ? ?Allergies as of 03/04/2022   ? ?   Reactions  ? Penicillins Swelling  ? Facial swelling ?Has patient had a PCN reaction causing immediate rash, facial/tongue/throat swelling, SOB or lightheadedness with hypotension: Yes ?Has patient had a PCN reaction causing severe rash involving mucus membranes or skin necrosis: No ?Has patient had a PCN reaction that required hospitalization: No ?Has patient had a PCN reaction occurring within the last 10 years: No ?If all of the above answers are "NO", then may proceed with Cephalosporin use.  ? ?  ? ?  ?Medication List  ?  ? ?TAKE these medications   ? ?acetaminophen 500 MG tablet ?Commonly known as: TYLENOL ?Take 650 mg by mouth daily. ?  ?carvedilol 6.25 MG tablet ?Commonly known as: COREG ?Take 12.5 mg by mouth daily. Takes 2 tablets at same time in the morning ?  ?clindamycin 300 MG capsule ?Commonly known as: CLEOCIN ?Take 1 capsule (300 mg total) by mouth 3  (three) times daily for 3 days. ?  ?denosumab 60 MG/ML Soln injection ?Commonly known as: PROLIA ?Inject 60 mg into the skin every 6 (six) months. Administer in upper arm, thigh, or abdomen ?  ?oxyCODONE-acetaminophen 5-325 MG tablet ?Commonly known as: Percocet ?Take 1 tablet by mouth every 4 (four) hours as needed for up to 3 days for severe pain. ?  ?telmisartan 40 MG tablet ?Commonly known as: MICARDIS ?Take 40 mg by mouth daily. ?  ?triazolam 0.25 MG tablet ?Commonly known as: HALCION ?Take 0.5-0.75 mg by mouth at bedtime. Depends on difficulty sleeping if takes 2-3 tablets ?  ?venlafaxine XR 150 MG 24 hr capsule ?Commonly known as: EFFEXOR-XR ?Take 300 mg by mouth daily. ?  ?Vitamin B-12 1000 MCG Subl ?Place under the tongue. ?  ?Vitamin D3 50 MCG (2000 UT) Tabs ?Take 4,000 Units by mouth daily. ?  ? ?  ? ? Follow-up Information   ? ? Leta Baptist, MD Follow up on 03/10/2022.   ?Specialty: Otolaryngology ?Why: at 1pm ?Contact information: ?[address] ?[address] ?[place] [address] ?[phone] ? ? ?  ?  ? ?  ?  ? ?  ? ? ?Signed: ?Burley Saver ?03/04/2022, 6:40 AM ? ? ?

## 2022-03-05 LAB — SURGICAL PATHOLOGY

## 2022-04-11 DIAGNOSIS — R197 Diarrhea, unspecified: Secondary | ICD-10-CM | POA: Diagnosis not present

## 2022-04-14 ENCOUNTER — Telehealth: Payer: Self-pay | Admitting: *Deleted

## 2022-04-14 DIAGNOSIS — M810 Age-related osteoporosis without current pathological fracture: Secondary | ICD-10-CM

## 2022-04-14 NOTE — Telephone Encounter (Addendum)
Deductible NO ? ?OOP MAX $4000 ($445 met) ? ?Annual exam  10/10/2021 ? ?Calcium 9.3             Date 05/01/2022 ? ?Upcoming dental procedures  ? ?Prior Authorization needed YES ?[id number] ?01/18/2020-12/31-2023 auth # [id number] ? ? ?Pt estimated Cost $40 reference [id number] ? ? ?Appt 05/06/2022 ? ? ? ?Coverage Details:$40 admin ? ?

## 2022-04-15 DIAGNOSIS — F3341 Major depressive disorder, recurrent, in partial remission: Secondary | ICD-10-CM | POA: Diagnosis not present

## 2022-04-15 DIAGNOSIS — R197 Diarrhea, unspecified: Secondary | ICD-10-CM | POA: Diagnosis not present

## 2022-04-15 NOTE — Telephone Encounter (Signed)
Patient due to have Prolia.  ?Last calcium 7.9 date: 02/06/2022 ?I will route to Provider for recommendations regarding her calcium before we give her Prolia ?

## 2022-04-15 NOTE — Telephone Encounter (Signed)
Left message for pt to return our call. 

## 2022-04-15 NOTE — Telephone Encounter (Signed)
Patient was hospitalized at the time last calcium was taken. Calcium likely low due to IV fluids. Please have her return for repeat. Thanks.  ?

## 2022-04-22 NOTE — Telephone Encounter (Signed)
Left message for pt to return our call. 

## 2022-04-28 ENCOUNTER — Other Ambulatory Visit: Payer: Medicare PPO

## 2022-04-30 NOTE — Telephone Encounter (Signed)
Lab 05/01/2021 upcoming ?

## 2022-05-01 ENCOUNTER — Other Ambulatory Visit: Payer: Medicare PPO

## 2022-05-01 DIAGNOSIS — M810 Age-related osteoporosis without current pathological fracture: Secondary | ICD-10-CM

## 2022-05-02 LAB — CALCIUM: Calcium: 9.3 mg/dL (ref 8.6–10.4)

## 2022-05-06 ENCOUNTER — Ambulatory Visit (INDEPENDENT_AMBULATORY_CARE_PROVIDER_SITE_OTHER): Payer: Medicare PPO | Admitting: Gynecology

## 2022-05-06 DIAGNOSIS — M810 Age-related osteoporosis without current pathological fracture: Secondary | ICD-10-CM

## 2022-05-06 MED ORDER — DENOSUMAB 60 MG/ML ~~LOC~~ SOSY
60.0000 mg | PREFILLED_SYRINGE | Freq: Once | SUBCUTANEOUS | Status: AC
  Administered 2022-05-06: 60 mg via SUBCUTANEOUS

## 2022-07-31 ENCOUNTER — Other Ambulatory Visit: Payer: Self-pay | Admitting: Family Medicine

## 2022-07-31 DIAGNOSIS — M8000XS Age-related osteoporosis with current pathological fracture, unspecified site, sequela: Secondary | ICD-10-CM

## 2022-09-02 DIAGNOSIS — M654 Radial styloid tenosynovitis [de Quervain]: Secondary | ICD-10-CM | POA: Diagnosis not present

## 2022-09-18 ENCOUNTER — Telehealth: Payer: Self-pay

## 2022-09-18 ENCOUNTER — Other Ambulatory Visit: Payer: Self-pay | Admitting: Obstetrics & Gynecology

## 2022-09-18 ENCOUNTER — Telehealth: Payer: Self-pay | Admitting: *Deleted

## 2022-09-18 ENCOUNTER — Ambulatory Visit: Payer: Medicare PPO | Admitting: Radiology

## 2022-09-18 VITALS — BP 102/64 | Temp 98.6°F

## 2022-09-18 DIAGNOSIS — N644 Mastodynia: Secondary | ICD-10-CM | POA: Diagnosis not present

## 2022-09-18 NOTE — Telephone Encounter (Signed)
Patient called to schedule screening mammo but mentioned she Is having left nipple burning/pain.  Recommended OV for breast exam so provider can decide/order diagnostic study if indicated. Patient agreeable and message sent to appt desk to schedule.

## 2022-09-18 NOTE — Telephone Encounter (Signed)
-----   Message from Kerry Dory, NP sent at 09/18/2022  2:51 PM EDT ----- Regarding: Diagnostic mammogram Left breast pain under nipple areola.

## 2022-09-18 NOTE — Telephone Encounter (Signed)
Patient scheduled for bilateral dx mammogram and left breast ultrasound on 10/30/22 @ 1:40 pm  Patient is due for mammogram screening on 09/25/22 since patient is having breast problem she will need bilateral so that will cover her mammogram screening as well.  Patient aware this is the first available and she can call daily/weekly to check for any cancellations.

## 2022-09-18 NOTE — Telephone Encounter (Signed)
Appt scheduled 09/18/22 at 3:00pm with Wende Crease, NP.

## 2022-09-18 NOTE — Progress Notes (Signed)
   Gina Fernandez 25-Dec-1951 [id number]   History:  71 y.o. c/o left breast pain under nipple x 2 days. No injury to the area, no discharge or other symptoms. Last mammogram 09/30/21 [facility]- 1  Gynecologic History Patient's last menstrual period was 12/29/2004.   Last mammogram: 10/22. Results were: normal  Obstetric History OB History  Gravida Para Term Preterm AB Living  1 1     0 1  SAB IAB Ectopic Multiple Live Births  0   0        # Outcome Date GA Lbr Len/2nd Weight Sex Delivery Anes PTL Lv  1 Para              The following portions of the patient's history were reviewed and updated as appropriate: allergies, current medications, past family history, past medical history, past social history, past surgical history, and problem list.  Review of Systems Pertinent items noted in HPI and remainder of comprehensive ROS otherwise negative.   Past medical history, past surgical history, family history and social history were all reviewed and documented in the EPIC chart.   Exam:  Vitals:   09/18/22 1436  BP: 102/64  Temp: 98.6 F (37 C)  TempSrc: Oral   There is no height or weight on file to calculate BMI.  General appearance:  Normal Breasts: breasts appear normal, no suspicious masses, no skin or nipple changes or axillary nodes.  + pain around and under areola of left breast  Patient informed chaperone available to be present for breast exam. Patient has requested no chaperone to be present.   Assessment/Plan:   1. Breast pain, left  Diagnostic left mammogram Vit E 400iu BID to help with pain, avoid caffeine   Gina Fernandez B WHNP-BC 3:10 PM 09/18/2022

## 2022-10-01 ENCOUNTER — Ambulatory Visit: Payer: Medicare PPO | Admitting: Family Medicine

## 2022-10-14 DIAGNOSIS — D37030 Neoplasm of uncertain behavior of the parotid salivary glands: Secondary | ICD-10-CM | POA: Diagnosis not present

## 2022-10-16 DIAGNOSIS — M654 Radial styloid tenosynovitis [de Quervain]: Secondary | ICD-10-CM | POA: Diagnosis not present

## 2022-10-22 ENCOUNTER — Telehealth: Payer: Self-pay | Admitting: *Deleted

## 2022-10-22 NOTE — Telephone Encounter (Signed)
Deductible n/a  OOP MAX n/a  Exam 09/18/2022  Calcium     9.3        Date 05/01/2022  Upcoming dental procedures NO  Prior Authorization needed n/a  Pt estimated Cost $40       Coverage Details: 0% one dose, $40 admin fee

## 2022-10-29 DIAGNOSIS — F3341 Major depressive disorder, recurrent, in partial remission: Secondary | ICD-10-CM | POA: Diagnosis not present

## 2022-10-30 ENCOUNTER — Ambulatory Visit
Admission: RE | Admit: 2022-10-30 | Discharge: 2022-10-30 | Disposition: A | Discharge status: Home / Self Care | Payer: Medicare PPO | Source: Ambulatory Visit | Attending: Radiology | Admitting: Radiology

## 2022-10-30 DIAGNOSIS — N644 Mastodynia: Secondary | ICD-10-CM

## 2022-10-31 DIAGNOSIS — Z1211 Encounter for screening for malignant neoplasm of colon: Secondary | ICD-10-CM | POA: Diagnosis not present

## 2022-10-31 DIAGNOSIS — R1314 Dysphagia, pharyngoesophageal phase: Secondary | ICD-10-CM | POA: Diagnosis not present

## 2022-10-31 DIAGNOSIS — R12 Heartburn: Secondary | ICD-10-CM | POA: Diagnosis not present

## 2022-10-31 DIAGNOSIS — K5900 Constipation, unspecified: Secondary | ICD-10-CM | POA: Diagnosis not present

## 2022-11-03 DIAGNOSIS — D123 Benign neoplasm of transverse colon: Secondary | ICD-10-CM | POA: Diagnosis not present

## 2022-11-03 DIAGNOSIS — Z1211 Encounter for screening for malignant neoplasm of colon: Secondary | ICD-10-CM | POA: Diagnosis not present

## 2022-11-03 DIAGNOSIS — R110 Nausea: Secondary | ICD-10-CM | POA: Diagnosis not present

## 2022-11-03 DIAGNOSIS — K2289 Other specified disease of esophagus: Secondary | ICD-10-CM | POA: Diagnosis not present

## 2022-11-03 DIAGNOSIS — K319 Disease of stomach and duodenum, unspecified: Secondary | ICD-10-CM | POA: Diagnosis not present

## 2022-11-03 DIAGNOSIS — K648 Other hemorrhoids: Secondary | ICD-10-CM | POA: Diagnosis not present

## 2022-11-03 DIAGNOSIS — D128 Benign neoplasm of rectum: Secondary | ICD-10-CM | POA: Diagnosis not present

## 2022-11-03 DIAGNOSIS — K2980 Duodenitis without bleeding: Secondary | ICD-10-CM | POA: Diagnosis not present

## 2022-11-03 DIAGNOSIS — D125 Benign neoplasm of sigmoid colon: Secondary | ICD-10-CM | POA: Diagnosis not present

## 2022-11-03 DIAGNOSIS — K5730 Diverticulosis of large intestine without perforation or abscess without bleeding: Secondary | ICD-10-CM | POA: Diagnosis not present

## 2022-11-03 DIAGNOSIS — D122 Benign neoplasm of ascending colon: Secondary | ICD-10-CM | POA: Diagnosis not present

## 2022-11-03 DIAGNOSIS — R1310 Dysphagia, unspecified: Secondary | ICD-10-CM | POA: Diagnosis not present

## 2022-11-05 DIAGNOSIS — K319 Disease of stomach and duodenum, unspecified: Secondary | ICD-10-CM | POA: Diagnosis not present

## 2022-11-05 DIAGNOSIS — K2289 Other specified disease of esophagus: Secondary | ICD-10-CM | POA: Diagnosis not present

## 2022-11-05 DIAGNOSIS — D122 Benign neoplasm of ascending colon: Secondary | ICD-10-CM | POA: Diagnosis not present

## 2022-11-05 DIAGNOSIS — K2980 Duodenitis without bleeding: Secondary | ICD-10-CM | POA: Diagnosis not present

## 2022-11-05 DIAGNOSIS — D128 Benign neoplasm of rectum: Secondary | ICD-10-CM | POA: Diagnosis not present

## 2022-11-05 DIAGNOSIS — D123 Benign neoplasm of transverse colon: Secondary | ICD-10-CM | POA: Diagnosis not present

## 2022-12-18 DIAGNOSIS — M654 Radial styloid tenosynovitis [de Quervain]: Secondary | ICD-10-CM | POA: Diagnosis not present

## 2022-12-23 ENCOUNTER — Ambulatory Visit
Admission: EM | Admit: 2022-12-23 | Discharge: 2022-12-23 | Disposition: A | Discharge status: Home / Self Care | Payer: Medicare PPO | Attending: Emergency Medicine | Admitting: Emergency Medicine

## 2022-12-23 ENCOUNTER — Ambulatory Visit (INDEPENDENT_AMBULATORY_CARE_PROVIDER_SITE_OTHER): Payer: Medicare PPO

## 2022-12-23 DIAGNOSIS — R059 Cough, unspecified: Secondary | ICD-10-CM | POA: Diagnosis not present

## 2022-12-23 DIAGNOSIS — J988 Other specified respiratory disorders: Secondary | ICD-10-CM

## 2022-12-23 DIAGNOSIS — R509 Fever, unspecified: Secondary | ICD-10-CM | POA: Diagnosis not present

## 2022-12-23 DIAGNOSIS — R0989 Other specified symptoms and signs involving the circulatory and respiratory systems: Secondary | ICD-10-CM | POA: Diagnosis not present

## 2022-12-23 DIAGNOSIS — J189 Pneumonia, unspecified organism: Secondary | ICD-10-CM

## 2022-12-23 DIAGNOSIS — Z1152 Encounter for screening for COVID-19: Secondary | ICD-10-CM | POA: Insufficient documentation

## 2022-12-23 DIAGNOSIS — M542 Cervicalgia: Secondary | ICD-10-CM | POA: Diagnosis not present

## 2022-12-23 MED ORDER — IBUPROFEN 400 MG PO TABS
400.0000 mg | ORAL_TABLET | Freq: Once | ORAL | Status: AC
  Administered 2022-12-23: 400 mg via ORAL

## 2022-12-23 MED ORDER — FLUCONAZOLE 150 MG PO TABS: ORAL_TABLET | ORAL | 0 refills | Status: DC

## 2022-12-23 MED ORDER — AZITHROMYCIN 250 MG PO TABS: ORAL_TABLET | ORAL | 0 refills | Status: AC

## 2022-12-23 MED ORDER — ONDANSETRON 4 MG PO TBDP
4.0000 mg | ORAL_TABLET | Freq: Once | ORAL | Status: AC
  Administered 2022-12-23: 4 mg via ORAL

## 2022-12-23 MED ORDER — GUAIFENESIN 400 MG PO TABS: ORAL_TABLET | ORAL | 0 refills | Status: DC

## 2022-12-23 MED ORDER — AMOXICILLIN-POT CLAVULANATE 875-125 MG PO TABS: 1.0000 | ORAL_TABLET | Freq: Two times a day (BID) | ORAL | 0 refills | Status: AC

## 2022-12-23 NOTE — ED Provider Notes (Signed)
[facility]    CSN: [id number] Arrival date & time: 12/23/22  8413    HISTORY   Chief Complaint  Patient presents with   Fever   Cough   HPI Gina Fernandez is a pleasant, 71 y.o. female who presents to urgent care today. Patient complains of "weeks and weeks" of sinusitis now accompanied by coughing and fever which began yesterday.  On arrival, patient's temperature is 99.1 with a heart rate of 113.  Patient states that while she has been waiting in the exam room to be seen, she has begun to feel more febrile and began to have intense neck pain radiating to her shoulders on both sides.  Patient states she is also started to feel little bit nauseated but describes the nausea is more of a burning sensation in her upper abdomen.  States has been taking Tylenol without meaningful relief of her symptoms.  Patient states she avoids ibuprofen because of her blood pressure but thinks it may be time to give ibuprofen to try.  Patient states she is not taking any cough syrup as of yet because the burning in her chest that she experiences while she is coughing just started this morning.  Patient states she takes care of her grandchildren and frequently catches respiratory infections from them.  Patient states she frequently has sinus infections, denies history of allergies or asthma, does not normally seek medical attention for sinus issues.  The history is provided by the patient.   Past Medical History:  Diagnosis Date   Anxiety    Depression    Fracture of L1 vertebra ([place])    Headache    has not had them since 30's   Hyperlipidemia    Hypertension    Osteoporosis 06/2019   T score -2.7 improved from prior DEXA on Prolia   TMJ (dislocation of temporomandibular joint)    Patient Active Problem List   Diagnosis Date Noted   H/O parotidectomy 03/03/2022   Depression    Hypertension    Osteoporosis 05/28/2010   Past Surgical History:  Procedure Laterality Date   FOOT  SURGERY  2010   LEFT FOOT   KYPHOPLASTY N/A 05/28/2017   Procedure: LUMBAR 1 KYPHOPLASTY;  Surgeon: Phylliss Bob, MD;  Location: [place];  Service: Orthopedics;  Laterality: N/A;  LUMBAR 1 KYPHOPLASTY; REQUEST 1 HOUR AND FLIP ROOM   NOSE SURGERY     PAROTIDECTOMY Left 03/03/2022   Procedure: TOTAL PAROTIDECTOMY;  Surgeon: Leta Baptist, MD;  Location: [place];  Service: ENT;  Laterality: Left;   SHOULDER SURGERY Right 08/2020   TEE WITHOUT CARDIOVERSION N/A 09/25/2015   Procedure: TRANSESOPHAGEAL ECHOCARDIOGRAM (TEE);  Surgeon: Adrian Prows, MD;  Location: [facility] ENDOSCOPY;  Service: Cardiovascular;  Laterality: N/A;   TONSILLECTOMY     OB History     Gravida  1   Para  1   Term      Preterm      AB  0   Living  1      SAB  0   IAB      Ectopic  0   Multiple      Live Births             Home Medications    Prior to Admission medications   Medication Sig Start Date End Date Taking? Authorizing Provider  acetaminophen (TYLENOL) 500 MG tablet Take 650 mg by mouth daily.    [provider]  carvedilol (COREG) 6.25 MG  tablet Take 12.5 mg by mouth daily. Takes 2 tablets at same time in the morning 05/05/16   [provider]  cloNIDine (CATAPRES) 0.1 MG tablet TK 1 T PO HS PRN IF DBP OVER 100    [provider]  denosumab (PROLIA) 60 MG/ML SOLN injection Inject 60 mg into the skin every 6 (six) months. Administer in upper arm, thigh, or abdomen    [provider]  dicyclomine (BENTYL) 20 MG tablet Take 20 mg by mouth 3 (three) times daily. 04/11/22   [provider]  L-Methylfolate 15 MG TABS Take by mouth. 08/29/22   [provider]  L-Methylfolate-Algae (DEPLIN 15) 15-90.314 MG CAPS Take by mouth. 08/12/22   [provider]  ondansetron (ZOFRAN) 4 MG tablet TAKE 1 TABLET BY MOUTH EVERY 6 TO 8 HOURS AS NEEDED FOR POST OPERATIVE NAUSEA    [provider]  telmisartan-hydrochlorothiazide (MICARDIS HCT)  40-12.5 MG tablet Take 1 tablet by mouth daily.    [provider]  triazolam (HALCION) 0.25 MG tablet Take 0.5-0.75 mg by mouth at bedtime. Depends on difficulty sleeping if takes 2-3 tablets    [provider]  venlafaxine XR (EFFEXOR-XR) 150 MG 24 hr capsule Take 300 mg by mouth daily.    [provider]    Family History Family History  Problem Relation Age of Onset   Hypertension Mother    Aneurysm Mother    Hypertension Father    Heart disease Father    Breast cancer Maternal Aunt 78   Cancer Sister        Bladder   Heart disease Brother    Heart failure Sister    Social History Social History   Tobacco Use   Smoking status: Never   Smokeless tobacco: Never  Vaping Use   Vaping Use: Never used  Substance Use Topics   Alcohol use: Yes    Alcohol/week: 8.0 standard drinks of alcohol    Types: 3 Glasses of wine, 5 Standard drinks or equivalent per week    Comment: weekends mostly   Drug use: No   Allergies   Penicillins and Statins  Review of Systems Review of Systems Pertinent findings revealed after performing a 14 point review of systems has been noted in the history of present illness.  Physical Exam Triage Vital Signs ED Triage Vitals  Enc Vitals Group     BP 10/25/21 0827 (!) 147/82     Pulse Rate 10/25/21 0827 72     Resp 10/25/21 0827 18     Temp 10/25/21 0827 98.3 F (36.8 C)     Temp Source 10/25/21 0827 Oral     SpO2 10/25/21 0827 98 %     Weight --      Height --      Head Circumference --      Peak Flow --      Pain Score 10/25/21 0826 5     Pain Loc --      Pain Edu? --      Excl. in [place]? --   No data found.  Updated Vital Signs BP 123/75 (BP Location: Right Arm)   Pulse (!) 108   Temp 98.9 F (37.2 C) (Oral)   Resp 18   LMP 12/29/2004   SpO2 94%   Physical Exam Vitals and nursing note reviewed.  Constitutional:      General: She is awake. She is not in acute distress.    Appearance: Normal  appearance. She is  well-developed and well-groomed. She is ill-appearing.  HENT:     Head: Normocephalic and atraumatic.     Salivary Glands: Right salivary gland is not diffusely enlarged or tender. Left salivary gland is not diffusely enlarged or tender.     Right Ear: Hearing, tympanic membrane, ear canal and external ear normal. No drainage. No middle ear effusion. There is no impacted cerumen. Tympanic membrane is not erythematous or bulging.     Left Ear: Hearing, tympanic membrane, ear canal and external ear normal. No drainage.  No middle ear effusion. There is no impacted cerumen. Tympanic membrane is not erythematous or bulging.     Nose: Mucosal edema, congestion and rhinorrhea present. No nasal deformity or septal deviation. Rhinorrhea is clear.     Right Turbinates: Not enlarged, swollen or pale.     Left Turbinates: Not enlarged, swollen or pale.     Right Sinus: No maxillary sinus tenderness or frontal sinus tenderness.     Left Sinus: No maxillary sinus tenderness or frontal sinus tenderness.     Mouth/Throat:     Lips: Pink. No lesions.     Mouth: Mucous membranes are moist. No oral lesions.     Dentition: Normal dentition.     Tongue: No lesions.     Palate: No mass and lesions.     Pharynx: Oropharynx is clear. Uvula midline. No posterior oropharyngeal erythema or uvula swelling.     Tonsils: No tonsillar exudate. 0 on the right. 0 on the left.     Comments: Pallor on posterior aspect of palate Eyes:     General: Lids are normal.        Right eye: No discharge.        Left eye: No discharge.     Extraocular Movements: Extraocular movements intact.     Conjunctiva/sclera: Conjunctivae normal.     Right eye: Right conjunctiva is not injected.     Left eye: Left conjunctiva is not injected.     Pupils: Pupils are equal, round, and reactive to light.  Neck:     Thyroid: No thyroid mass.     Trachea: Trachea and phonation normal.  Cardiovascular:     Rate and Rhythm:  Normal rate and regular rhythm.     Pulses: Normal pulses.     Heart sounds: Normal heart sounds, S1 normal and S2 normal. No murmur heard.    No friction rub. No gallop.  Pulmonary:     Effort: Pulmonary effort is normal. No tachypnea, bradypnea, accessory muscle usage, prolonged expiration or respiratory distress.     Breath sounds: No stridor, decreased air movement or transmitted upper airway sounds. Examination of the right-lower field reveals decreased breath sounds. Examination of the left-lower field reveals decreased breath sounds. Decreased breath sounds present. No wheezing, rhonchi or rales.  Chest:     Chest wall: No tenderness.  Musculoskeletal:        General: Normal range of motion.     Cervical back: Full passive range of motion without pain, normal range of motion and neck supple. No edema, erythema, signs of trauma, rigidity, torticollis or crepitus. Muscular tenderness present. No pain with movement or spinous process tenderness. Normal range of motion.  Lymphadenopathy:     Cervical: No cervical adenopathy.  Skin:    General: Skin is warm and dry.     Findings: No erythema or rash.  Neurological:     General: No focal deficit present.     Mental Status: She is  alert and oriented to person, place, and time.  Psychiatric:        Mood and Affect: Mood normal.        Behavior: Behavior normal. Behavior is cooperative.     Visual Acuity Right Eye Distance:   Left Eye Distance:   Bilateral Distance:    Right Eye Near:   Left Eye Near:    Bilateral Near:     [facility] / Diagnostics / Procedures:     Radiology DG Chest 2 View  Result Date: 12/23/2022 CLINICAL DATA:  Decreased breath sounds in lower lung fields, burning with cough, fever. EXAM: CHEST - 2 VIEW COMPARISON:  Chest radiographs 05/26/2017 FINDINGS: The cardiomediastinal silhouette is unchanged with normal heart size. The lungs are hyperinflated. Mild pleural thickening is noted in the lung apices. A  new small area opacity is present laterally in the left lung base. The right lung is clear. No pleural effusion or pneumothorax is identified. Old left rib fractures and a previously augmented L1 compression fracture are noted. IMPRESSION: New mild opacity in the left lung base, query early pneumonia. Electronically Signed   By: Logan Bores M.D.   On: 12/23/2022 10:07    Procedures Procedures (including critical care time) EKG  Pending results:  Labs Reviewed - No data to display  Medications Ordered in UC: Medications  ibuprofen (ADVIL) tablet 400 mg (400 mg Oral Given 12/23/22 0953)  ondansetron (ZOFRAN-ODT) disintegrating tablet 4 mg (4 mg Oral Given 12/23/22 0953)    UC Diagnoses / Final Clinical Impressions(s)   I have reviewed the triage vital signs and the nursing notes.  Pertinent labs & imaging results that were available during my care of the patient were reviewed by me and considered in my medical decision making (see chart for details).    Final diagnoses:  Pneumonia of left lower lobe due to infectious organism  Respiratory infection   Patient advised of chest x-ray findings.  Recommend dual therapy with Augmentin and azithromycin for empiric treatment of presumed bacterial pneumonia.  Patient provided with Diflucan for antibiotic vulvovaginal candidal yeast infection secondary to antibiotic use.  Patient advised to use guaifenesin to promote expectoration.  Return Precautions advised. Please see discharge instructions below for further details of plan of care as provided to patient. ED Prescriptions     Medication Sig Dispense Auth. Provider   amoxicillin-clavulanate (AUGMENTIN) 875-125 MG tablet Take 1 tablet by mouth 2 (two) times daily for 5 days. 10 tablet Lynden Oxford Scales, PA-C   azithromycin (ZITHROMAX) 250 MG tablet Take 2 tablets (500 mg total) by mouth daily for 1 day, THEN 1 tablet (250 mg total) daily for 4 days. 6 tablet Lynden Oxford Scales, PA-C    fluconazole (DIFLUCAN) 150 MG tablet Take 1 tablet on day 4 of antibiotics.  Take second tablet 3 days later. 2 tablet Lynden Oxford Scales, PA-C   guaifenesin (HUMIBID E) 400 MG TABS tablet Take 1 tablet 3 times daily as needed for chest congestion and cough 21 tablet Lynden Oxford Scales, PA-C      PDMP not reviewed this encounter.  Disposition Upon Discharge:  Condition: stable for discharge home Home: take medications as prescribed; routine discharge instructions as discussed; follow up as advised.  Patient presented with an acute illness with associated systemic symptoms and significant discomfort requiring urgent management. In my opinion, this is a condition that a prudent lay person (someone who possesses an average knowledge of health and medicine) may potentially expect to result in  complications if not addressed urgently such as respiratory distress, impairment of bodily function or dysfunction of bodily organs.   Routine symptom specific, illness specific and/or disease specific instructions were discussed with the patient and/or caregiver at length.   As such, the patient has been evaluated and assessed, work-up was performed and treatment was provided in alignment with urgent care protocols and evidence based medicine.  Patient/parent/caregiver has been advised that the patient may require follow up for further testing and treatment if the symptoms continue in spite of treatment, as clinically indicated and appropriate.  If the patient was tested for COVID-19, Influenza and/or RSV, then the patient/parent/guardian was advised to isolate at home pending the results of his/her diagnostic coronavirus test and potentially longer if they're positive. I have also advised pt that if his/her COVID-19 test returns positive, it's recommended to self-isolate for at least 10 days after symptoms first appeared AND until fever-free for 24 hours without fever reducer AND other symptoms have  improved or resolved. Discussed self-isolation recommendations as well as instructions for household member/close contacts as per the [facility] and [place] DHHS, and also gave patient the [place] packet with this information.  Patient/parent/caregiver has been advised to return to the [facility] or PCP in 3-5 days if no better; to PCP or the Emergency Department if new signs and symptoms develop, or if the current signs or symptoms continue to change or worsen for further workup, evaluation and treatment as clinically indicated and appropriate  The patient will follow up with their current PCP if and as advised. If the patient does not currently have a PCP we will assist them in obtaining one.   The patient may need specialty follow up if the symptoms continue, in spite of conservative treatment and management, for further workup, evaluation, consultation and treatment as clinically indicated and appropriate.  Patient/parent/caregiver verbalized understanding and agreement of plan as discussed.  All questions were addressed during visit.  Please see discharge instructions below for further details of plan.  Discharge Instructions:   Discharge Instructions      Your chest x-ray is concerning for early signs of left lower lobe pneumonia.  I recommend that we start you on some antibiotics now to help you feel better quickly.  Please read below to learn more about the medications, dosages and frequencies that I recommend to help alleviate your symptoms and to get you feeling better soon:   Augmentin (amoxicillin - clavulanic acid):  take 1 tablet twice daily for 5 days, you can take it with or without food.  This antibiotic can cause upset stomach, this will resolve once antibiotics are complete.  You are welcome to use a probiotic, eat yogurt, take Imodium while taking this medication.  Please avoid other systemic medications such as Maalox, Pepto-Bismol or milk of magnesia as they can interfere with your body's  ability to absorb the antibiotics. Z-Pak (azithromycin):  take 2 tablets the first day, then take 1 tablet daily every day thereafter until complete.   Diflucan (fluconazole): As you may or may not be aware, taking antibiotics can often cause patients to develop a vaginal yeast infection.  For this reason, I have provided you with a prescription for Diflucan, and antifungal medication used to treat vaginal yeast infections.  Please take the first Diflucan tablet on day 3 or 4 of your antibiotic therapy, and take the second Diflucan tablet 3 days later.  You do not need to pick up this prescription or take this medication unless you  develop symptoms of vaginal yeast infection including thick, white vaginal discharge and/or vaginal itching.  This prescription has been provided as a Manufacturing engineer and for your convenience.   Robitussin, Mucinex (guaifenesin): This is an expectorant.  This helps break up chest congestion and loosen up thick nasal drainage making phlegm and drainage more liquid and therefore easier to remove.  I recommend being 400 mg three times daily as needed.      You received a COVID-19 PCR test today.  The result of your COVID-19 test will be posted to your MyChart once it is complete, typically this takes 24 to 36 hours.      If your COVID-19 PCR test is negative, please consider retesting in the next 2 to 3 days, particularly if you are not feeling any better.  You are welcome to return here to urgent care to have it done or you can take a home COVID-19 test.    If both your COVID-19 tests are negative, then you can safely assume that your illness is due to one of the many less serious illnesses circulating in our community right now.  Conservative care is recommended with rest, drinking plenty of clear fluids, eating only when hungry, taking supportive medications for your symptoms and avoiding being around other people.  Please remain at home until you are fever free for 24 hours without  the use of antifever medications such as Tylenol and ibuprofen.  Please be sure that you are taking your heartburn medicine daily to avoid stomach upset.  Please be advised that Diflucan does interfere with Halcion so please do not take them within 2 hours of each other.  Please follow-up within the next 5-7 days either with your primary care provider or urgent care if your symptoms do not resolve.  If you do not have a primary care provider, we will assist you in finding one.        Thank you for visiting urgent care today.  We appreciate the opportunity to participate in your care.         This office note has been dictated using Museum/gallery curator.  Unfortunately, this method of dictation can sometimes lead to typographical or grammatical errors.  I apologize for your inconvenience in advance if this occurs.  Please do not hesitate to reach out to me if clarification is needed.      Lynden Oxford Scales, PA-C 12/25/22 1525

## 2022-12-23 NOTE — ED Triage Notes (Signed)
Pt present coughing with fever. The coughing started yesterday and the fever started last night. Pt has taking a lot of tylenol.

## 2022-12-23 NOTE — Discharge Instructions (Addendum)
Your chest x-ray is concerning for early signs of left lower lobe pneumonia.  I recommend that we start you on some antibiotics now to help you feel better quickly.  Please read below to learn more about the medications, dosages and frequencies that I recommend to help alleviate your symptoms and to get you feeling better soon:   Augmentin (amoxicillin - clavulanic acid):  take 1 tablet twice daily for 5 days, you can take it with or without food.  This antibiotic can cause upset stomach, this will resolve once antibiotics are complete.  You are welcome to use a probiotic, eat yogurt, take Imodium while taking this medication.  Please avoid other systemic medications such as Maalox, Pepto-Bismol or milk of magnesia as they can interfere with your body's ability to absorb the antibiotics. Z-Pak (azithromycin):  take 2 tablets the first day, then take 1 tablet daily every day thereafter until complete.   Diflucan (fluconazole): As you may or may not be aware, taking antibiotics can often cause patients to develop a vaginal yeast infection.  For this reason, I have provided you with a prescription for Diflucan, and antifungal medication used to treat vaginal yeast infections.  Please take the first Diflucan tablet on day 3 or 4 of your antibiotic therapy, and take the second Diflucan tablet 3 days later.  You do not need to pick up this prescription or take this medication unless you develop symptoms of vaginal yeast infection including thick, white vaginal discharge and/or vaginal itching.  This prescription has been provided as a Manufacturing engineer and for your convenience.   Robitussin, Mucinex (guaifenesin): This is an expectorant.  This helps break up chest congestion and loosen up thick nasal drainage making phlegm and drainage more liquid and therefore easier to remove.  I recommend being 400 mg three times daily as needed.      You received a COVID-19 PCR test today.  The result of your COVID-19 test will be  posted to your MyChart once it is complete, typically this takes 24 to 36 hours.      If your COVID-19 PCR test is negative, please consider retesting in the next 2 to 3 days, particularly if you are not feeling any better.  You are welcome to return here to urgent care to have it done or you can take a home COVID-19 test.    If both your COVID-19 tests are negative, then you can safely assume that your illness is due to one of the many less serious illnesses circulating in our community right now.  Conservative care is recommended with rest, drinking plenty of clear fluids, eating only when hungry, taking supportive medications for your symptoms and avoiding being around other people.  Please remain at home until you are fever free for 24 hours without the use of antifever medications such as Tylenol and ibuprofen.  Please be sure that you are taking your heartburn medicine daily to avoid stomach upset.  Please be advised that Diflucan does interfere with Halcion so please do not take them within 2 hours of each other.  Please follow-up within the next 5-7 days either with your primary care provider or urgent care if your symptoms do not resolve.  If you do not have a primary care provider, we will assist you in finding one.        Thank you for visiting urgent care today.  We appreciate the opportunity to participate in your care.

## 2022-12-24 LAB — SARS CORONAVIRUS 2 (TAT 6-24 HRS): SARS Coronavirus 2: NEGATIVE

## 2022-12-31 ENCOUNTER — Telehealth: Payer: Self-pay | Admitting: *Deleted

## 2022-12-31 DIAGNOSIS — E782 Mixed hyperlipidemia: Secondary | ICD-10-CM | POA: Diagnosis not present

## 2022-12-31 DIAGNOSIS — J189 Pneumonia, unspecified organism: Secondary | ICD-10-CM | POA: Diagnosis not present

## 2022-12-31 DIAGNOSIS — K2950 Unspecified chronic gastritis without bleeding: Secondary | ICD-10-CM | POA: Diagnosis not present

## 2022-12-31 DIAGNOSIS — I10 Essential (primary) hypertension: Secondary | ICD-10-CM | POA: Diagnosis not present

## 2022-12-31 DIAGNOSIS — I779 Disorder of arteries and arterioles, unspecified: Secondary | ICD-10-CM | POA: Diagnosis not present

## 2022-12-31 DIAGNOSIS — F330 Major depressive disorder, recurrent, mild: Secondary | ICD-10-CM | POA: Diagnosis not present

## 2022-12-31 DIAGNOSIS — M810 Age-related osteoporosis without current pathological fracture: Secondary | ICD-10-CM | POA: Diagnosis not present

## 2022-12-31 DIAGNOSIS — K2980 Duodenitis without bleeding: Secondary | ICD-10-CM | POA: Diagnosis not present

## 2022-12-31 DIAGNOSIS — G4700 Insomnia, unspecified: Secondary | ICD-10-CM | POA: Diagnosis not present

## 2022-12-31 NOTE — Telephone Encounter (Signed)
[employer] insurance verification has been sent awaiting Summary of benefits  

## 2023-01-09 NOTE — Telephone Encounter (Signed)
Deductible n/a  OOP MAX $4000 ($0 met)  Last OV  09/18/2022  Calcium  9.3           Date 05/01/2022  Upcoming dental procedures NO Is Prior Authorization needed required #[id number] Valid 12/29/2023   Pt estimated Cost $0     Appt 01/13/2023

## 2023-01-13 ENCOUNTER — Ambulatory Visit: Payer: Medicare PPO

## 2023-01-21 ENCOUNTER — Ambulatory Visit
Admission: RE | Admit: 2023-01-21 | Discharge: 2023-01-21 | Disposition: A | Discharge status: Home / Self Care | Payer: Medicare PPO | Source: Ambulatory Visit | Attending: Family Medicine | Admitting: Family Medicine

## 2023-01-21 DIAGNOSIS — M8000XS Age-related osteoporosis with current pathological fracture, unspecified site, sequela: Secondary | ICD-10-CM

## 2023-01-21 DIAGNOSIS — Z780 Asymptomatic menopausal state: Secondary | ICD-10-CM | POA: Diagnosis not present

## 2023-01-21 DIAGNOSIS — M8589 Other specified disorders of bone density and structure, multiple sites: Secondary | ICD-10-CM | POA: Diagnosis not present

## 2023-02-04 ENCOUNTER — Telehealth: Payer: Self-pay | Admitting: *Deleted

## 2023-02-04 DIAGNOSIS — J189 Pneumonia, unspecified organism: Secondary | ICD-10-CM | POA: Diagnosis not present

## 2023-02-04 DIAGNOSIS — I10 Essential (primary) hypertension: Secondary | ICD-10-CM | POA: Diagnosis not present

## 2023-02-04 NOTE — Telephone Encounter (Signed)
Spoke with patient, understands she will need a med check exam or a annual exam to proceed with Prolia. Pt will call me back when she would like to r/s appt

## 2023-02-24 DIAGNOSIS — F3341 Major depressive disorder, recurrent, in partial remission: Secondary | ICD-10-CM | POA: Diagnosis not present

## 2023-04-06 DIAGNOSIS — E782 Mixed hyperlipidemia: Secondary | ICD-10-CM | POA: Diagnosis not present

## 2023-04-15 DIAGNOSIS — I10 Essential (primary) hypertension: Secondary | ICD-10-CM | POA: Diagnosis not present

## 2023-04-15 DIAGNOSIS — R1013 Epigastric pain: Secondary | ICD-10-CM | POA: Diagnosis not present

## 2023-04-23 DIAGNOSIS — F3341 Major depressive disorder, recurrent, in partial remission: Secondary | ICD-10-CM | POA: Diagnosis not present

## 2023-06-19 IMAGING — US US SOFT TISSUE HEAD/NECK
1 series · 10 of 10 positions shown · non-contrast
Comparison: None available.

CLINICAL DATA: Initial evaluation for parotid discomfort.

EXAM:
ULTRASOUND OF HEAD/NECK SOFT TISSUES
TECHNIQUE: Ultrasound examination of the head and neck soft tissues was
performed in the area of clinical concern.

[Series 1: us soft tissue head/neck · 0.05mm/px · 10 of 10 slices shown]
[im 1/10]
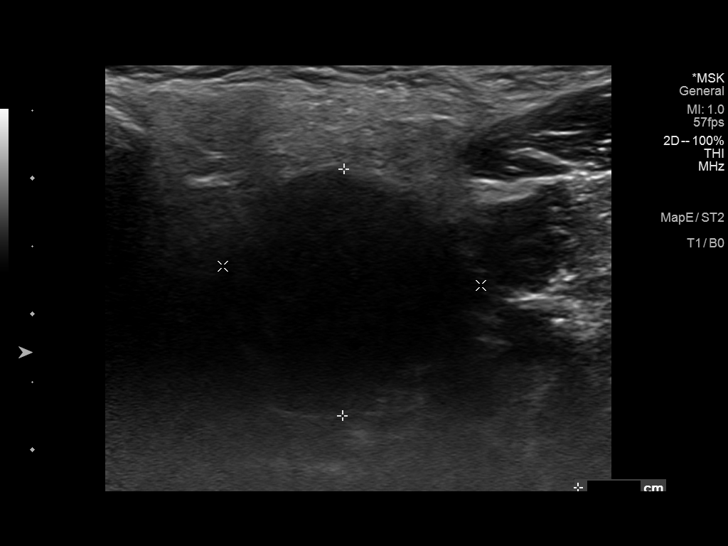
[im 2/10]
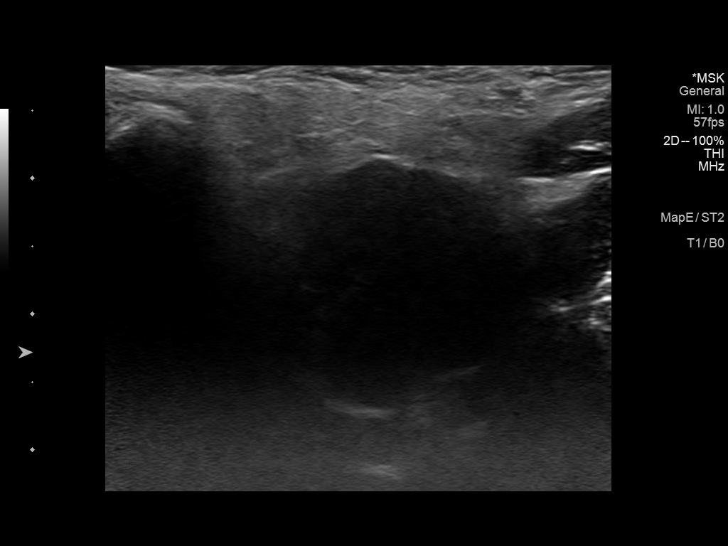
[im 3/10]
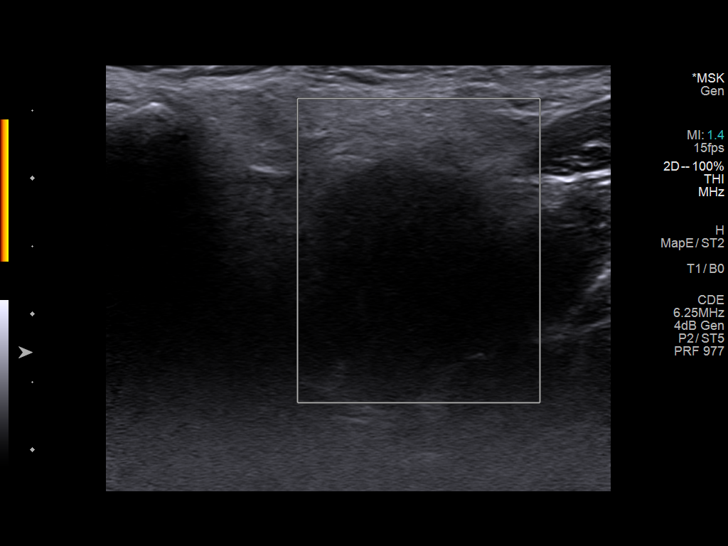
[im 4/10]
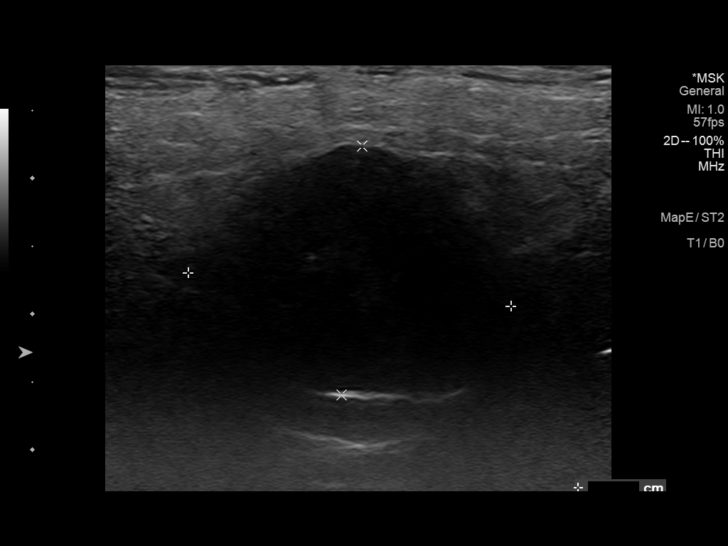
[im 5/10]
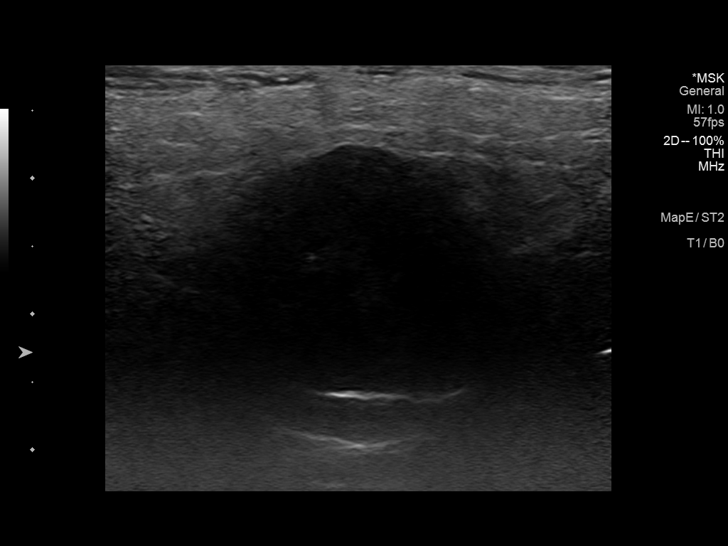
[im 6/10]
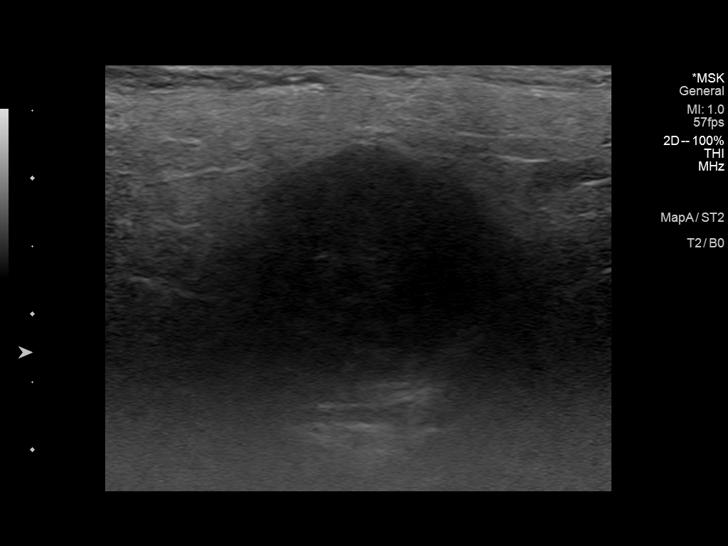
[im 7/10]
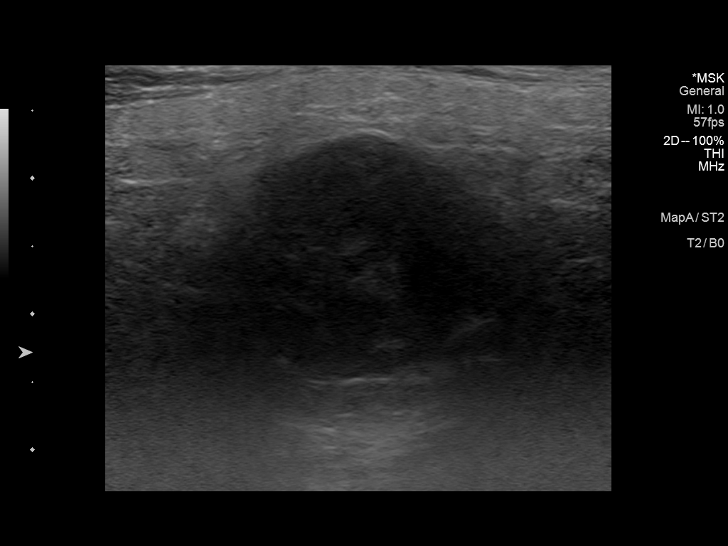
[im 8/10]
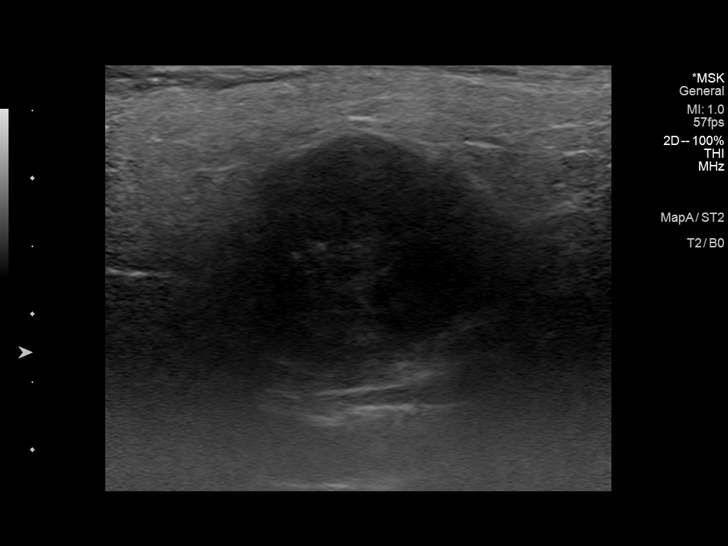
[im 9/10]
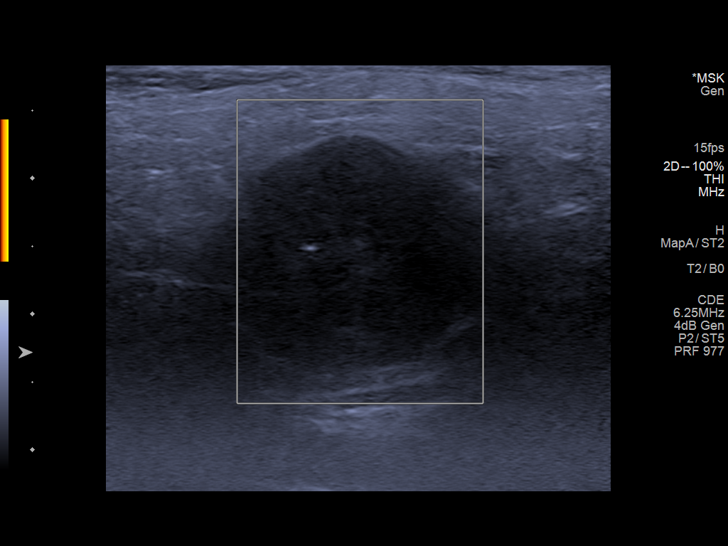
[im 10/10]
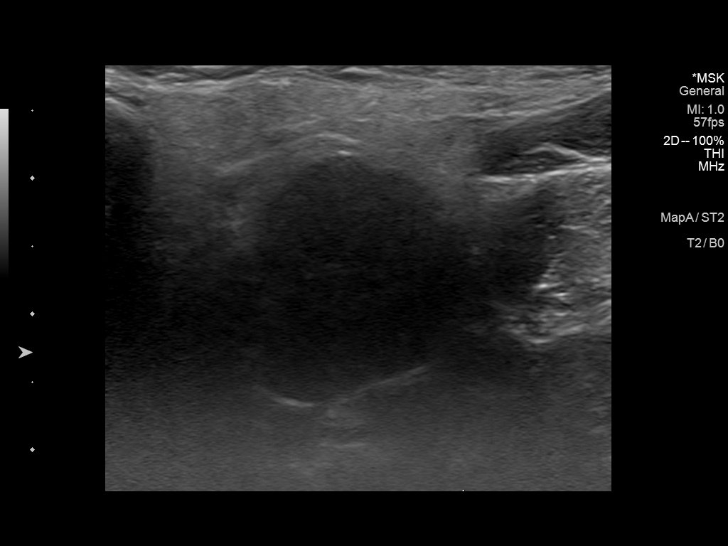

[10 of 10 positions shown; findings below may reference images not displayed]

FINDINGS: Targeted ultrasound of the left parotid gland was performed.
Ultrasound demonstrates and ovoid hypoechoic mass measuring 2.4 x
1.8 x 1.9 cm (image 5). This appears to lie within the parotid gland
itself. No visible internal vascularity with color Doppler imaging.
The visualized surrounding glandular tissue is otherwise normal.
Remainder of the visualized fibromuscular soft tissues are
unremarkable.
IMPRESSION: 2.4 x 1.8 x 1.9 cm hypoechoic mass within the left parotid gland.
Finding is indeterminate, but concerning for a primary parotid
neoplasm. ENT consultation and referral suggested. This lesion would
likely be amenable to ultrasound-guided FNA. Additionally, further
evaluation with dedicated CT of the neck with contrast could be
performed for further evaluation as warranted.

## 2023-06-24 ENCOUNTER — Telehealth: Payer: Self-pay | Admitting: *Deleted

## 2023-06-24 DIAGNOSIS — Z13220 Encounter for screening for lipoid disorders: Secondary | ICD-10-CM

## 2023-06-24 DIAGNOSIS — M810 Age-related osteoporosis without current pathological fracture: Secondary | ICD-10-CM

## 2023-06-24 NOTE — Telephone Encounter (Signed)
Spoke with patient.  Last AEX 10/10/21, last Prolia 05/06/22. OV with Jami on 06/18/22 for breast pain.   Patient states she is unsure if she wants to proceed with Prolia at this time. Advised OV needed or updated AEX to continue Prolia. Patient declines OV at this time, request to schedule AEX. Scheduled for 10/12/23 at 3pm with TW. Patient asking if she can get Prolia at this visit? Advised if benefits reviewed and labs up to date. BMD 01/21/23 by PCP, patient states not discussed with PCP. Advised I will forward to Tiffany to review when she returns to the office on 7/1 and f/u with recommendations. Patient agreeable.   Tiffany -please review and advise.

## 2023-06-25 ENCOUNTER — Other Ambulatory Visit: Payer: Self-pay | Admitting: Family Medicine

## 2023-06-25 DIAGNOSIS — R1084 Generalized abdominal pain: Secondary | ICD-10-CM

## 2023-06-25 DIAGNOSIS — K5790 Diverticulosis of intestine, part unspecified, without perforation or abscess without bleeding: Secondary | ICD-10-CM | POA: Diagnosis not present

## 2023-06-25 DIAGNOSIS — Z6824 Body mass index (BMI) 24.0-24.9, adult: Secondary | ICD-10-CM | POA: Diagnosis not present

## 2023-06-25 DIAGNOSIS — R110 Nausea: Secondary | ICD-10-CM

## 2023-06-29 IMAGING — CT CT NECK W/ CM
5 of 6 series · 14 of 35 positions shown, 16 images · IV contrast (iopamidol)
Comparison: Correlation made with recent ultrasound

CLINICAL DATA: Left parotid mass

EXAM:
CT NECK WITH CONTRAST
TECHNIQUE: Multidetector CT imaging of the neck was performed using the
standard protocol following the bolus administration of intravenous
contrast.
CONTRAST:  75mL PKXSMB-2TT IOPAMIDOL (PKXSMB-2TT) INJECTION 61%

[Series 2: neck 2.00 br40 s3 st/ no angle · axial · 0.44mm/px · z∈[-739,-657]mm · 2 of 124 slices shown, 3 images]
[im 42/124  soft-tissue]
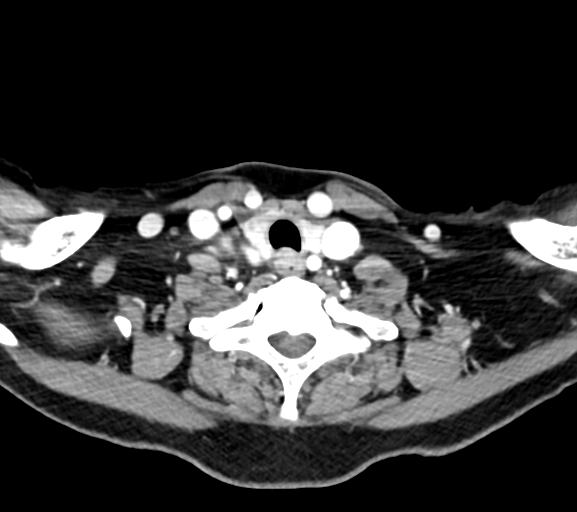
[im 42/124  bone]
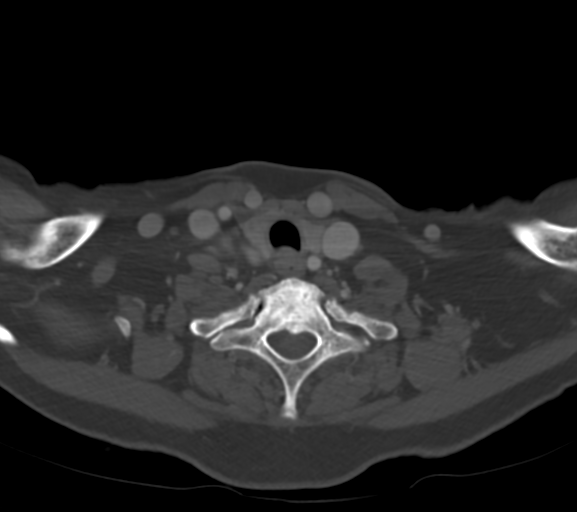
[im 83/124  bone]
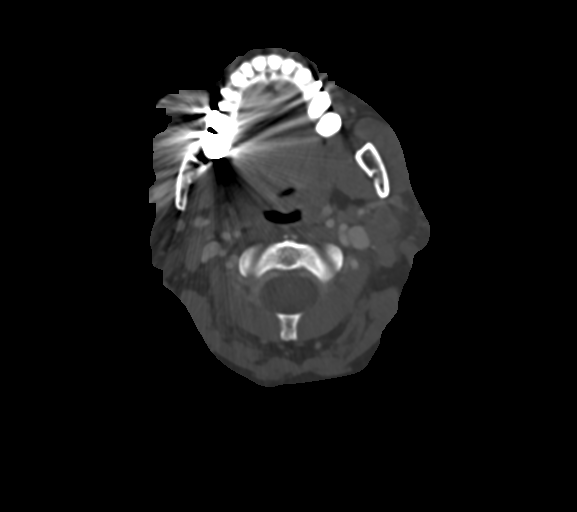

[Series 4: neck 2.00 br60 s3 bone/ no angle · axial · 0.44mm/px · z∈[-739,-657]mm · 2 of 124 slices shown]
[im 42/124  bone]
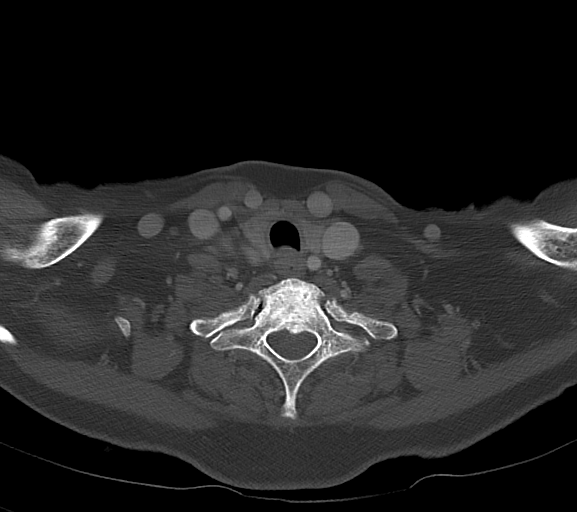
[im 83/124  bone]
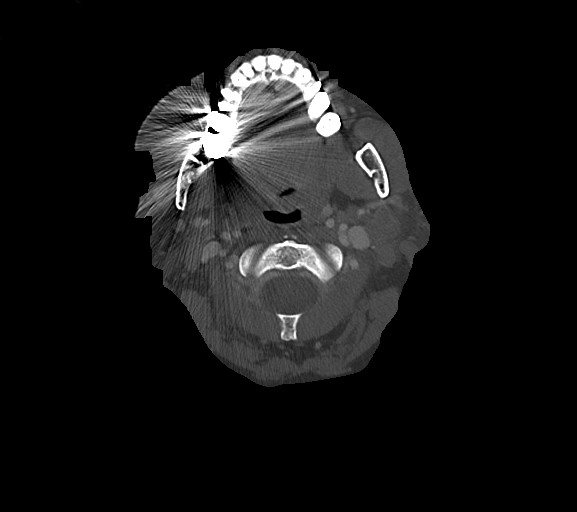

[Series 6: neck 2.00 br36 s3 angled axial (person_name) · axial · 0.44mm/px · z∈[-739,-657]mm · 2 of 124 slices shown]
[im 42/124  bone]
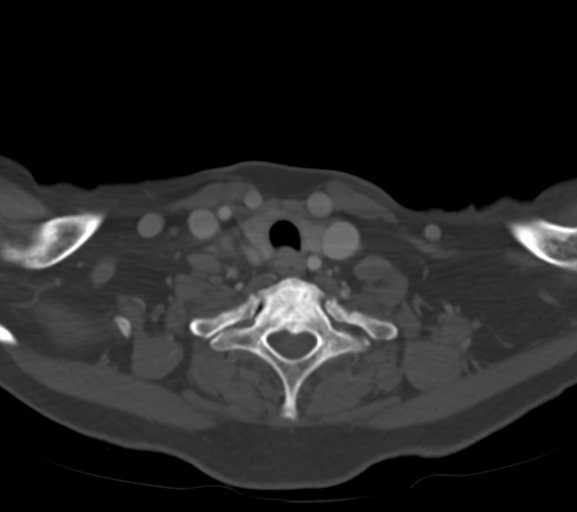
[im 83/124  bone]
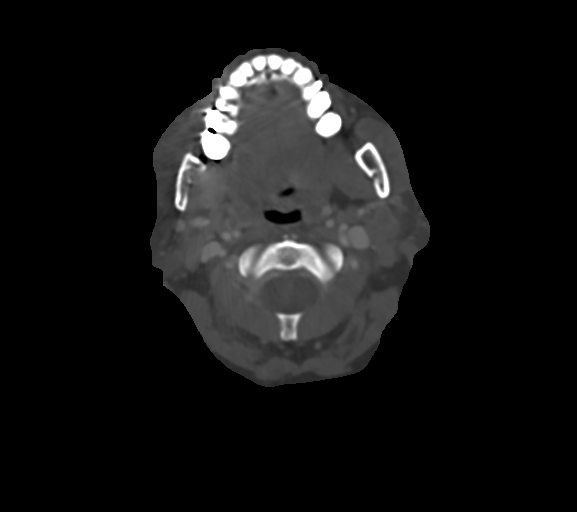

[Series 10: neck 2.00 br40 s3 (person_name) · coronal · 0.49mm/px · 3 of 122 slices shown (1 of 2)]
[im 25/122  bone]
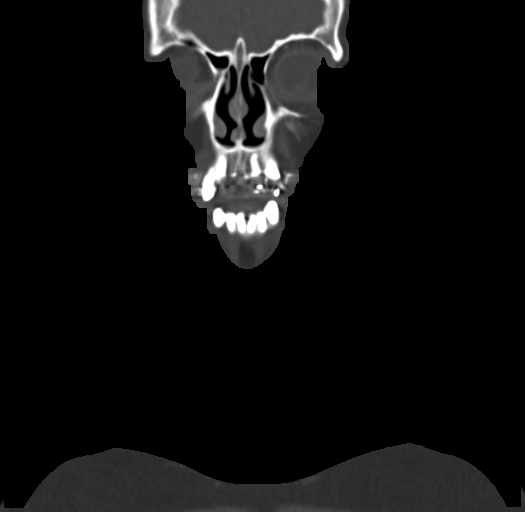
[im 49/122  bone]
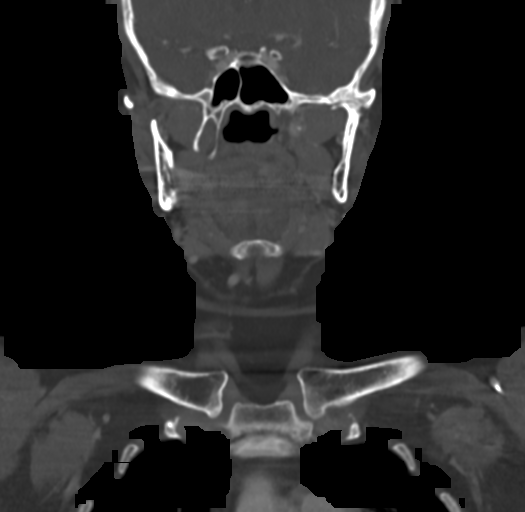
[im 73/122  bone]
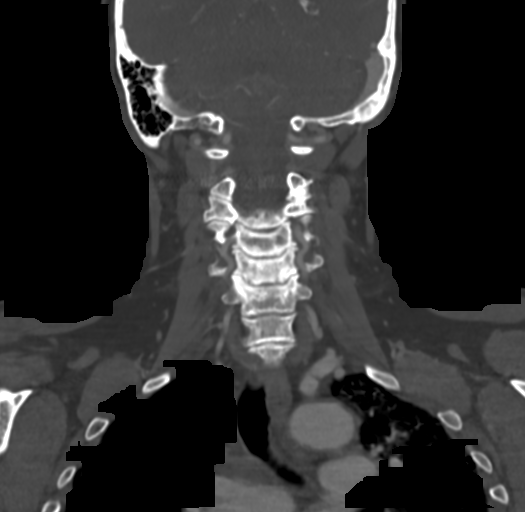

[Series 12: neck 2.00 br40 s3 (person_name) · sagittal · 0.44mm/px · 5 of 127 slices shown, 6 images (2 of 2)]
[im 43/127  bone]
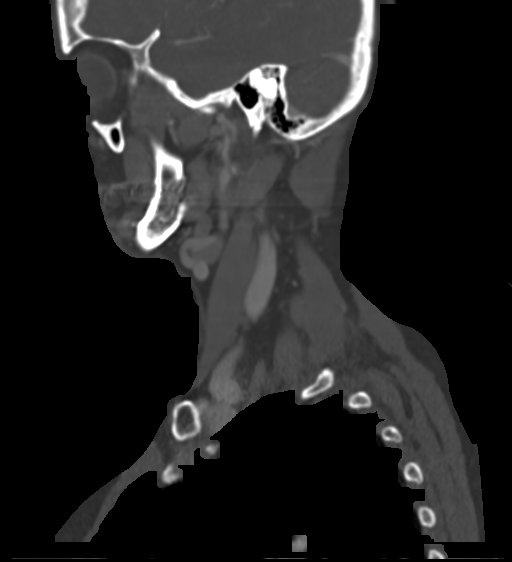
[im 53/127  bone]
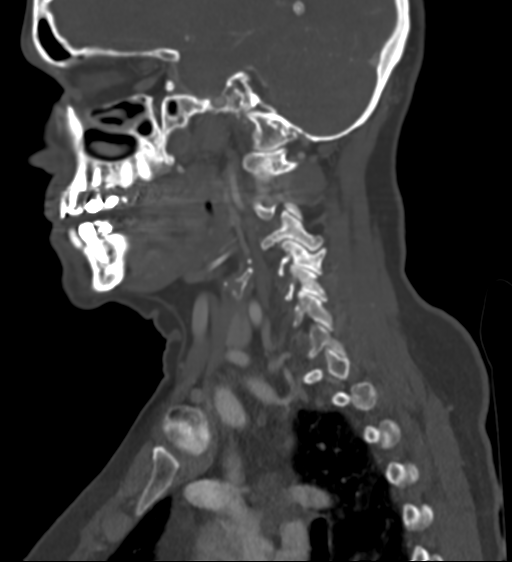
[im 64/127  soft-tissue]
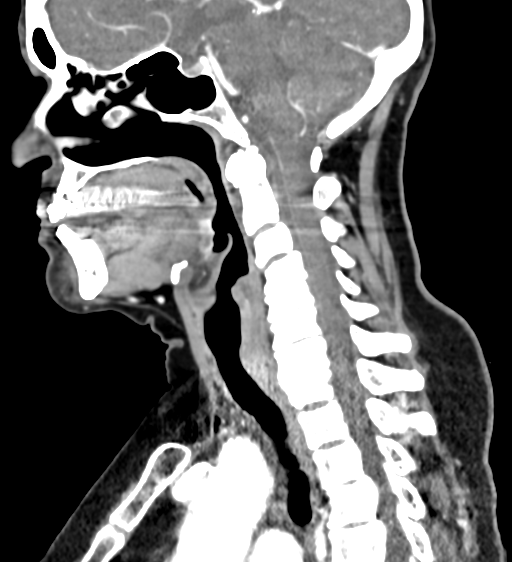
[im 64/127  bone]
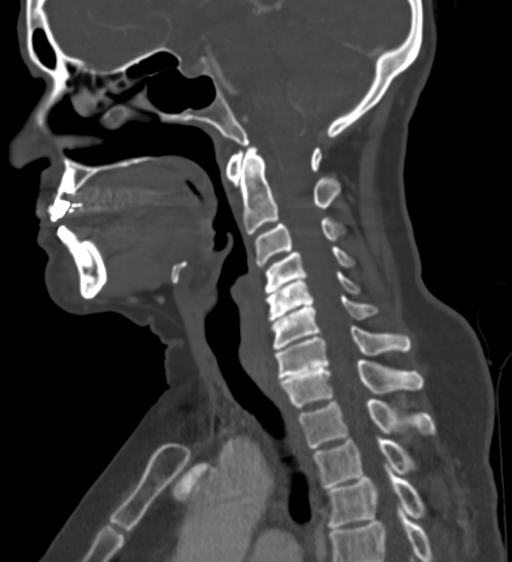
[im 74/127  bone]
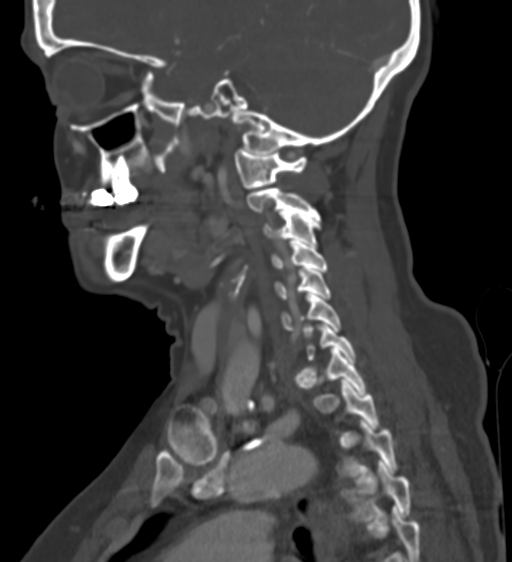
[im 85/127  bone]
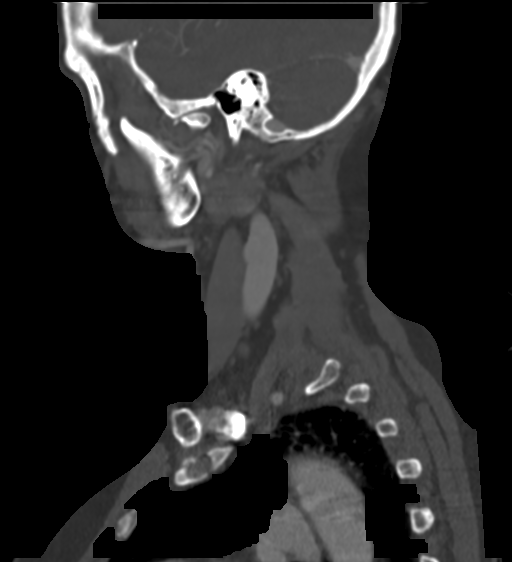

[14 of 35 positions shown; findings below may reference images not displayed]

FINDINGS: Pharynx and larynx: Unremarkable.  No mass or swelling.

Salivary glands: Skin marker overlies the left parotid. There is
streak artifact through the deep parotid where there is a mass
measuring approximately 1.5 cm. Right parotid and both submandibular
glands are unremarkable.

Thyroid: Normal.

Lymph nodes: No enlarged or abnormal density nodes.

Vascular: Major neck vessels are patent. Mild plaque at the common
carotid bifurcations.

Limited intracranial: No abnormal enhancement.

Visualized orbits: Unremarkable.

Mastoids and visualized paranasal sinuses: Mild mucosal thickening.
Mastoid air cells are clear.

Skeleton: Degenerative changes of the cervical spine. Degenerative
changes at the temporomandibular joints.

Upper chest: No apical lung mass.

Other: None.
IMPRESSION: Approximally 1.5 cm mass of the deep lobe of the left parotid. Exact
measurement is difficult as there is significant streak artifact
through this region. Likely represents a primary parotid neoplasm.
While typically benign, tissue sampling is recommended to exclude
malignancy.

No adenopathy.

## 2023-06-29 NOTE — Telephone Encounter (Signed)
OK to receive Prolia at AEX if she would like to continue. UTD on DXA but needs calcium before then. Thanks.

## 2023-07-20 ENCOUNTER — Encounter: Payer: Self-pay | Admitting: Family Medicine

## 2023-07-20 NOTE — Telephone Encounter (Signed)
Left message to call Jill, RN at [facility], [phone], option 5.  

## 2023-07-22 ENCOUNTER — Ambulatory Visit
Admission: RE | Admit: 2023-07-22 | Discharge: 2023-07-22 | Disposition: A | Discharge status: Home / Self Care | Payer: Medicare PPO | Source: Ambulatory Visit | Attending: Family Medicine | Admitting: Family Medicine

## 2023-07-22 DIAGNOSIS — K5730 Diverticulosis of large intestine without perforation or abscess without bleeding: Secondary | ICD-10-CM | POA: Diagnosis not present

## 2023-07-22 DIAGNOSIS — R110 Nausea: Secondary | ICD-10-CM

## 2023-07-22 DIAGNOSIS — K5790 Diverticulosis of intestine, part unspecified, without perforation or abscess without bleeding: Secondary | ICD-10-CM

## 2023-07-22 DIAGNOSIS — R1011 Right upper quadrant pain: Secondary | ICD-10-CM | POA: Diagnosis not present

## 2023-07-22 DIAGNOSIS — R1084 Generalized abdominal pain: Secondary | ICD-10-CM

## 2023-07-22 DIAGNOSIS — I700 Atherosclerosis of aorta: Secondary | ICD-10-CM | POA: Diagnosis not present

## 2023-07-22 MED ORDER — IOPAMIDOL (ISOVUE-370) INJECTION 76%
75.0000 mL | Freq: Once | INTRAVENOUS | Status: AC | PRN
  Administered 2023-07-22: 75 mL via INTRAVENOUS

## 2023-08-13 NOTE — Telephone Encounter (Signed)
Routing to Nicolaus C for f/u

## 2023-08-13 NOTE — Telephone Encounter (Signed)
Insurance information submitted to [employer] portal. Will await summary of benefits for prolia.    

## 2023-08-18 NOTE — Telephone Encounter (Signed)
Call to patient. Prolia benefits reviewed with patient and she verbalized understanding. Patient advised would need to schedule lab appointment for calcium level to be checked prior to appointment with Elmarie Shiley, NP in November. Patient agreeable. Patient requesting to return call to office to schedule. States she has some upcoming travel would like to make appointment once she gets back. RN advised would add prolia to same day as AEX once lab work back and within normal range.   Patient also requesting to have cholesterol checked with lab draw. Routing to provider to review and advise. Will place future orders if appropriate.  Routing to [place], NP for review.

## 2023-08-18 NOTE — Telephone Encounter (Signed)
Future orders placed 

## 2023-08-18 NOTE — Telephone Encounter (Signed)
OK to add lipid panel per request but results will need to be reviewed with PCP by patient.

## 2023-08-18 NOTE — Telephone Encounter (Signed)
Deductible:   none  OOP MAX: $4,000 ($120 met)   Annual exam: 10-10-21, scheduled 11-05-23.   Calcium:     will return call to office to schedule-- will be traveling the next few weeks       Date:  Upcoming dental procedures: No   Hx of Kidney Disease: No   Last Bone Density Scan: 01-21-23   Is Prior Authorization needed: yes-- RN call to [facility]-- PA on file and good through 12-29-23. Authorization #: [id number]. Spoke with [employer], Customer Service Rep, reference # for call: [id number].   Pt estimated Cost: $80   Coverage Details: Prolia and administration are subject to a $40 copay, which contributes to a $4,000 deductible ($120 met). No deductible or coinsurance applies.

## 2023-10-08 DIAGNOSIS — F3341 Major depressive disorder, recurrent, in partial remission: Secondary | ICD-10-CM | POA: Diagnosis not present

## 2023-10-12 ENCOUNTER — Encounter: Payer: Medicare PPO | Admitting: Nurse Practitioner

## 2023-11-05 ENCOUNTER — Encounter: Payer: Medicare PPO | Admitting: Nurse Practitioner

## 2023-11-13 ENCOUNTER — Other Ambulatory Visit: Payer: Medicare PPO

## 2023-11-13 DIAGNOSIS — Z13220 Encounter for screening for lipoid disorders: Secondary | ICD-10-CM | POA: Diagnosis not present

## 2023-11-13 DIAGNOSIS — M810 Age-related osteoporosis without current pathological fracture: Secondary | ICD-10-CM | POA: Diagnosis not present

## 2023-11-14 LAB — LIPID PANEL
Cholesterol: 238 mg/dL — ABNORMAL HIGH (ref <200)
HDL: 94 mg/dL (ref >=50)
LDL Cholesterol (Calc): 121 mg/dL — ABNORMAL HIGH
Non-HDL Cholesterol (Calc): 144 mg/dL — ABNORMAL HIGH (ref <130)
Total CHOL/HDL Ratio: 2.5 (calc) (ref <5.0)
Triglycerides: 115 mg/dL (ref <150)

## 2023-11-14 LAB — CALCIUM: Calcium: 10.2 mg/dL (ref 8.6–10.4)

## 2023-12-02 ENCOUNTER — Encounter: Payer: Medicare PPO | Admitting: Obstetrics and Gynecology

## 2023-12-07 ENCOUNTER — Encounter: Payer: Medicare PPO | Admitting: Obstetrics and Gynecology

## 2024-01-07 DIAGNOSIS — F3341 Major depressive disorder, recurrent, in partial remission: Secondary | ICD-10-CM | POA: Diagnosis not present

## 2024-01-13 DIAGNOSIS — R7981 Abnormal blood-gas level: Secondary | ICD-10-CM | POA: Diagnosis not present

## 2024-01-13 DIAGNOSIS — R079 Chest pain, unspecified: Secondary | ICD-10-CM | POA: Diagnosis not present

## 2024-01-13 DIAGNOSIS — Z6823 Body mass index (BMI) 23.0-23.9, adult: Secondary | ICD-10-CM | POA: Diagnosis not present

## 2024-01-13 DIAGNOSIS — R5383 Other fatigue: Secondary | ICD-10-CM | POA: Diagnosis not present

## 2024-01-14 ENCOUNTER — Other Ambulatory Visit (HOSPITAL_COMMUNITY): Payer: Self-pay | Admitting: Family Medicine

## 2024-01-14 DIAGNOSIS — R0609 Other forms of dyspnea: Secondary | ICD-10-CM

## 2024-01-15 ENCOUNTER — Ambulatory Visit (HOSPITAL_COMMUNITY)
Admission: RE | Admit: 2024-01-15 | Discharge: 2024-01-15 | Disposition: A | Discharge status: Home / Self Care | Payer: Medicare PPO | Source: Ambulatory Visit | Attending: Family Medicine | Admitting: Family Medicine

## 2024-01-15 DIAGNOSIS — R0609 Other forms of dyspnea: Secondary | ICD-10-CM | POA: Insufficient documentation

## 2024-01-15 DIAGNOSIS — I2510 Atherosclerotic heart disease of native coronary artery without angina pectoris: Secondary | ICD-10-CM | POA: Diagnosis not present

## 2024-01-15 DIAGNOSIS — I700 Atherosclerosis of aorta: Secondary | ICD-10-CM | POA: Diagnosis not present

## 2024-01-15 MED ORDER — IOHEXOL 350 MG/ML SOLN
75.0000 mL | Freq: Once | INTRAVENOUS | Status: AC | PRN
  Administered 2024-01-15: 75 mL via INTRAVENOUS

## 2024-01-19 ENCOUNTER — Encounter: Payer: Self-pay | Admitting: Cardiology

## 2024-01-27 ENCOUNTER — Ambulatory Visit: Payer: Medicare PPO | Attending: Cardiology | Admitting: Cardiology

## 2024-01-27 ENCOUNTER — Other Ambulatory Visit: Payer: Self-pay

## 2024-01-27 ENCOUNTER — Encounter: Payer: Self-pay | Admitting: Cardiology

## 2024-01-27 VITALS — BP 130/78 | HR 85 | Ht 62.0 in | Wt 124.0 lb

## 2024-01-27 DIAGNOSIS — I25118 Atherosclerotic heart disease of native coronary artery with other forms of angina pectoris: Secondary | ICD-10-CM | POA: Diagnosis not present

## 2024-01-27 DIAGNOSIS — I200 Unstable angina: Secondary | ICD-10-CM

## 2024-01-27 DIAGNOSIS — I2510 Atherosclerotic heart disease of native coronary artery without angina pectoris: Secondary | ICD-10-CM

## 2024-01-27 DIAGNOSIS — R0609 Other forms of dyspnea: Secondary | ICD-10-CM | POA: Diagnosis not present

## 2024-01-27 DIAGNOSIS — E7800 Pure hypercholesterolemia, unspecified: Secondary | ICD-10-CM | POA: Diagnosis not present

## 2024-01-27 DIAGNOSIS — Z01810 Encounter for preprocedural cardiovascular examination: Secondary | ICD-10-CM | POA: Diagnosis not present

## 2024-01-27 MED ORDER — SIMVASTATIN 40 MG PO TABS: 40.0000 mg | ORAL_TABLET | Freq: Every day | ORAL | 2 refills | Status: DC

## 2024-01-27 MED ORDER — NITROGLYCERIN 0.4 MG SL SUBL: 0.4000 mg | SUBLINGUAL_TABLET | SUBLINGUAL | 1 refills | Status: AC | PRN

## 2024-01-27 MED ORDER — AMLODIPINE BESYLATE 5 MG PO TABS: 5.0000 mg | ORAL_TABLET | Freq: Every day | ORAL | 2 refills | Status: DC

## 2024-01-27 MED ORDER — ASPIRIN 81 MG PO CHEW: 81.0000 mg | CHEWABLE_TABLET | Freq: Every day | ORAL | Status: AC

## 2024-01-27 NOTE — Patient Instructions (Signed)
Medication Instructions:  Your physician has recommended you make the following change in your medication:  Start amlodipine 5 mg by mouth daily  Start simvastatin 40 mg by mouth daily  Start aspirin 81 mg by mouth daily  A prescription for nitroglycerin to use as needed has been sent to your pharmacy  *If you need a refill on your cardiac medications before your next appointment, please call your pharmacy*   Lab Work: Your physician recommends that you have lab work today- BMET and CBC.  If you have labs (blood work) drawn today and your tests are completely normal, you will receive your results only by: MyChart Message (if you have MyChart) OR A paper copy in the mail If you have any lab test that is abnormal or we need to change your treatment, we will call you to review the results.   Testing/Procedures:  Your physician has requested that you have a cardiac catheterization. Cardiac catheterization is used to diagnose and/or treat various heart conditions. Doctors may recommend this procedure for a number of different reasons. The most common reason is to evaluate chest pain. Chest pain can be a symptom of coronary artery disease (CAD), and cardiac catheterization can show whether plaque is narrowing or blocking your heart's arteries. This procedure is also used to evaluate the valves, as well as measure the blood flow and oxygen levels in different parts of your heart. For further information please visit [email or web address] Please follow instruction sheet, as given.  Follow-Up: At [facility], you and your health needs are our priority.  As part of our continuing mission to provide you with exceptional heart care, we have created designated Provider Care Teams.  These Care Teams include your primary Cardiologist (physician) and Advanced Practice Providers (APPs -  Physician Assistants and Nurse Practitioners) who all work together to provide you with the care you need, when you  need it.  We recommend signing up for the patient portal called "MyChart".  Sign up information is provided on this After Visit Summary.  MyChart is used to connect with patients for Virtual Visits (Telemedicine).  Patients are able to view lab/test results, encounter notes, upcoming appointments, etc.  Non-urgent messages can be sent to your provider as well.   To learn more about what you can do with MyChart, go to [email or web address].    Your next appointment:   4 week(s)  Provider:   Yates Decamp, MD or APP    Other Instructions    [facility] A DEPT OF [place] AT [facility] [address] [place], [place] [address] [place] [address] Dept: [phone] Loc: [phone]  Gina Fernandez  01/27/2024  You are scheduled for a Cardiac Catheterization on Monday, February 10 with Dr.  Jacinto Halim .  1. Please arrive at the [facility] (Main Entrance A) at [facility]: [address] [place], [place] [address] at 5:30 AM (This time is 2 hour(s) before your procedure to ensure your preparation).   Free valet parking service is available. You will check in at ADMITTING. The support person will be asked to wait in the waiting room.  It is OK to have someone drop you off and come back when you are ready to be discharged.    Special note: Every effort is made to have your procedure done on time. Please understand that emergencies sometimes delay scheduled procedures.  2. Diet: Do not eat solid foods after midnight.  The patient  may have clear liquids until 5am upon the day of the procedure.  3. Labs: You will need to have blood drawn on Wednesday, January 29 at [facility]     You do not need to be fasting.  4. Medication instructions in preparation for your procedure:   Contrast Allergy: No  On the morning of your procedure, take your Aspirin 81 mg and any morning medicines NOT listed above.  You may use sips of water.  5. Plan to  go home the same day, you will only stay overnight if medically necessary. 6. Bring a current list of your medications and current insurance cards. 7. You MUST have a responsible person to drive you home. 8. Someone MUST be with you the first 24 hours after you arrive home or your discharge will be delayed. 9. Please wear clothes that are easy to get on and off and wear slip-on shoes.  Thank you for allowing [place] to care for you!   -- [facility]

## 2024-01-27 NOTE — Progress Notes (Signed)
Cardiology Office Note:  .   Date:  01/27/2024  ID:  Gina Fernandez, DOB June 27, 1951, MRN [id number] PCP: Gweneth Dimitri, MD  [facility] Providers Cardiologist:  Yates Decamp, MD   History of Present Illness: Gina Fernandez is a 73 y.o. Caucasian female patient was referred to me for evaluation of chest pain and dyspnea on exertion that started about 2 months ago.  Past medical history significant for hypertension, hypercholesterolemia, family history of premature heart disease, father having had MI at age 33, brother had CABG at age 76.  Discussed the use of AI scribe software for clinical note transcription with the patient, who gave verbal consent to proceed.  History of Present Illness   The patient, with high cholesterol and hypertension, presents with chest tightness and shortness of breath. She was referred by Sigmund Hazel for evaluation of chest tightness and shortness of breath.  Chest tightness and shortness of breath have been present for the past two to two and a half months. The chest tightness radiates to the neck, particularly when climbing stairs or carrying objects. Heaviness in the chest has been present for about four to five months, starting around Thanksgiving. These symptoms occur with physical exertion, such as rushing or walking fast, and sometimes even upon waking. Exhaustion and inability to perform daily activities are noted due to these symptoms.  There is a history of high cholesterol, with previous trials of atorvastatin and ezetimibe, which were discontinued due to side effects and lack of efficacy. Cholesterol levels have worsened over time, and she is not currently on any cholesterol medication.  There is a history of shortness of breath, with a prior evaluation in 2016 for similar symptoms. However, the current symptoms are different and more severe.  She lives alone with two cats. Her son and grandchildren live nearby, and the grandchildren  sometimes stay with her. No smoking. Clonidine is taken occasionally for high blood pressure, approximately once every two weeks when the diastolic pressure reaches 104-105 mmHg.       Labs   Lab Results  Component Value Date   CHOL 238 (H) 11/13/2023   HDL 94 11/13/2023   LDLCALC 121 (H) 11/13/2023   TRIG 115 11/13/2023   CHOLHDL 2.5 11/13/2023   Lab Results  Component Value Date   NA 136 02/06/2022   K 4.1 02/06/2022   CO2 16 (L) 02/06/2022   GLUCOSE 96 02/06/2022   BUN 18 02/06/2022   CREATININE 0.74 02/06/2022   CALCIUM 10.2 11/13/2023   EGFR 85 02/06/2022   GFRNONAA >60 10/17/2019      Latest Ref Rng & Units 11/13/2023    9:39 AM 05/01/2022    9:37 AM 02/06/2022    3:31 PM  BMP  Glucose 70 - 99 mg/dL   96   BUN 8 - 27 mg/dL   18   Creatinine 4.09 - 1.00 mg/dL   8.11   BUN/Creat Ratio 12 - 28   24   Sodium 134 - 144 mmol/L   136   Potassium 3.5 - 5.2 mmol/L   4.1   Chloride 96 - 106 mmol/L   100   CO2 20 - 29 mmol/L   16   Calcium 8.6 - 10.4 mg/dL 91.4  9.3  7.9       Latest Ref Rng & Units 02/06/2022    3:31 PM 10/17/2019    3:48 PM 12/25/2018    5:38 PM  CBC  WBC 3.4 -  10.8 x10E3/uL 5.4  5.9  8.2   Hemoglobin 11.1 - 15.9 g/dL 16.1  09.6  04.5   Hematocrit 34.0 - 46.6 % 41.9  45.7  46.3   Platelets 150 - 450 x10E3/uL 198  288  225    External Labs:  PCP fax labs labs 01/13/2024:  TSH normal at 1.20.  Troponin T 17, minimally elevated from normal (0-14).  Serum glucose 95 mg, BUN 22, creatinine 0.71, EGFR 89 mL, potassium 5.0.  Hb 13.8/HCT 41.3, platelets 264, normal indicis.  Labs 04/06/2023:  Total cholesterol 239, triglycerides 131, HDL 85, LDL 131.  Review of Systems  Cardiovascular:  Positive for chest pain and dyspnea on exertion. Negative for leg swelling.   Physical Exam:   VS:  BP 130/78 (BP Location: Left Arm)   Pulse 85   Ht 5\' 2"  (1.575 m)   Wt 124 lb (56.2 kg)   LMP 12/29/2004   SpO2 97%   BMI 22.68 kg/m    Wt Readings from Last 3  Encounters:  01/27/24 124 lb (56.2 kg)  03/03/22 131 lb 6.3 oz (59.6 kg)  10/10/21 126 lb (57.2 kg)    Physical Exam Neck:     Vascular: No carotid bruit or JVD.  Cardiovascular:     Rate and Rhythm: Normal rate and regular rhythm.     Pulses: Intact distal pulses.     Heart sounds: Normal heart sounds. No murmur heard.    No gallop.  Pulmonary:     Effort: Pulmonary effort is normal.     Breath sounds: Normal breath sounds.  Abdominal:     General: Bowel sounds are normal.     Palpations: Abdomen is soft.  Musculoskeletal:     Right lower leg: No edema.     Left lower leg: No edema.    Studies Reviewed: .    CT angiogram chest 01/15/2024: 1. No evidence of pulmonary embolus. 2. No acute intrathoracic process. 3. Aortic Atherosclerosis (ICD10-I70.0). Coronary artery atherosclerosis. EKG:    EKG Interpretation Date/Time:  Wednesday January 27 2024 12:21:49 EST Ventricular Rate:  85 PR Interval:  150 QRS Duration:  86 QT Interval:  360 QTC Calculation: 428 R Axis:   -79  Text Interpretation: EKG 01/27/2024: Sinus rhythm with occasional PVCs (1, left anterior fascicular block.  Incomplete right bundle branch block.  Low-voltage complexes. Compared to 02/24/2022, no significant change. Confirmed by Delrae Rend [phone]) on 01/27/2024 12:28:43 PM    Medications and allergies    Allergies  Allergen Reactions   Penicillins Swelling    Patient states her mother told her that PCN causes her face to swell but states that she tolerates amoxicillin well.     Statins     Body aches      Current Outpatient Medications:    acetaminophen (TYLENOL) 500 MG tablet, Take 650 mg by mouth daily., Disp: , Rfl:    amLODipine (NORVASC) 5 MG tablet, Take 1 tablet (5 mg total) by mouth daily., Disp: 30 tablet, Rfl: 2   aspirin (ASPIRIN CHILDRENS) 81 MG chewable tablet, Chew 1 tablet (81 mg total) by mouth daily., Disp: , Rfl:    carvedilol (COREG) 6.25 MG tablet, Take 12.5 mg by mouth  daily. Takes 2 tablets at same time in the morning, Disp: , Rfl:    cloNIDine (CATAPRES) 0.1 MG tablet, TK 1 T PO HS PRN IF DBP OVER 100, Disp: , Rfl:    denosumab (PROLIA) 60 MG/ML SOLN injection, Inject 60 mg into the  skin every 6 (six) months. Administer in upper arm, thigh, or abdomen, Disp: , Rfl:    L-Methylfolate 15 MG TABS, Take by mouth., Disp: , Rfl:    nitroGLYCERIN (NITROSTAT) 0.4 MG SL tablet, Place 1 tablet (0.4 mg total) under the tongue every 5 (five) minutes as needed for chest pain., Disp: 25 tablet, Rfl: 1   simvastatin (ZOCOR) 40 MG tablet, Take 1 tablet (40 mg total) by mouth at bedtime., Disp: 30 tablet, Rfl: 2   telmisartan-hydrochlorothiazide (MICARDIS HCT) 40-12.5 MG tablet, Take 1 tablet by mouth daily., Disp: , Rfl:    triazolam (HALCION) 0.25 MG tablet, Take 0.5-0.75 mg by mouth at bedtime. Depends on difficulty sleeping if takes 2-3 tablets, Disp: , Rfl:    venlafaxine XR (EFFEXOR-XR) 150 MG 24 hr capsule, Take 300 mg by mouth daily., Disp: , Rfl:    ASSESSMENT AND PLAN: .      ICD-10-CM   1. Intermediate coronary syndrome (HCC)  I20.0 EKG 12-Lead    amLODipine (NORVASC) 5 MG tablet    nitroGLYCERIN (NITROSTAT) 0.4 MG SL tablet    aspirin (ASPIRIN CHILDRENS) 81 MG chewable tablet    Informed Consent Details: Physician/Practitioner Attestation; Transcribe to consent form and obtain patient signature    Basic metabolic panel    CBC with Differential/Platelet    2. Coronary artery calcification seen on CAT scan  I25.10 Informed Consent Details: Physician/Practitioner Attestation; Transcribe to consent form and obtain patient signature    Basic metabolic panel    CBC with Differential/Platelet    3. Dyspnea on exertion  R06.09 Basic metabolic panel    CBC with Differential/Platelet    4. Pure hypercholesterolemia  E78.00 simvastatin (ZOCOR) 40 MG tablet    Basic metabolic panel    CBC with Differential/Platelet     Assessment and Plan    Unstable  Angina Presents with chest tightness and dyspnea for 4-5 months, with heaviness radiating to the neck, exacerbated by exertion. Elevated troponin levels noted previously; EKG negative for MI. CT angiogram revealed plaque buildup, indicating significant coronary artery blockage. Discussed catheterization procedure, including risks (<1% risk of death, stroke, MI, or urgent CABG) and benefits (identifying and potentially treating blockages with stents or balloons).  There has been rapid progression of her symptoms over the past 1 to 2 months, serum troponin also minimally abnormal at PCP office and fortunately EKG does not reveal any ACS features. - Start simvastatin 40 mg QHS - Start aspirin 81 mg daily - Start amlodipine 5 mg daily - Prescribe nitroglycerin PRN for chest tightness - Schedule left heart catheterization and possible PCI. Schedule for cardiac catheterization, and possible angioplasty. We discussed regarding risks, benefits, alternatives to this including stress testing, CTA and continued medical therapy. Patient wants to proceed. Understands <1-2% risk of death, stroke, MI, urgent CABG, bleeding, infection, renal failure but not limited to these.  - Advise to avoid strenuous activities and cold exposure - Ensure post-procedure supervision - Document preference for temporary life support in case of cardiac or respiratory arrest  Hypertension Currently on carvedilol and hydrochlorothiazide. BP control is crucial given suspected CAD. - Continue current antihypertensives - Start amlodipine 5 mg daily  Hyperlipidemia Elevated cholesterol levels despite previous atorvastatin and ezetimibe treatment. - Start simvastatin 40 mg QHS  Follow-up - Schedule follow-up in 4-6 weeks post-catheterization - Instruct to seek emergency care for severe chest pain or if multiple nitroglycerin tablets are required.       Signed,  Yates Decamp, MD, [facility] 01/27/2024, 9:45  PM [facility] [address] [place], [place] [address] Phone: [phone]. Fax:  [phone]

## 2024-01-28 ENCOUNTER — Encounter: Payer: Self-pay | Admitting: Cardiology

## 2024-01-28 LAB — CBC WITH DIFFERENTIAL/PLATELET
Basophils Absolute: 0 10*3/uL (ref 0.0–0.2)
Basos: 1 %
EOS (ABSOLUTE): 0.1 10*3/uL (ref 0.0–0.4)
Eos: 1 %
Hematocrit: 42.8 % (ref 34.0–46.6)
Hemoglobin: 14.8 g/dL (ref 11.1–15.9)
Immature Grans (Abs): 0 10*3/uL (ref 0.0–0.1)
Immature Granulocytes: 0 %
Lymphocytes Absolute: 1.8 10*3/uL (ref 0.7–3.1)
Lymphs: 32 %
MCH: 32.2 pg (ref 26.6–33.0)
MCHC: 34.6 g/dL (ref 31.5–35.7)
MCV: 93 fL (ref 79–97)
Monocytes Absolute: 0.5 10*3/uL (ref 0.1–0.9)
Monocytes: 9 %
Neutrophils Absolute: 3 10*3/uL (ref 1.4–7.0)
Neutrophils: 57 %
Platelets: 342 10*3/uL (ref 150–450)
RBC: 4.59 x10E6/uL (ref 3.77–5.28)
RDW: 13.1 % (ref 11.7–15.4)
WBC: 5.4 10*3/uL (ref 3.4–10.8)

## 2024-01-28 LAB — BASIC METABOLIC PANEL
BUN/Creatinine Ratio: 29 — ABNORMAL HIGH (ref 12–28)
BUN: 24 mg/dL (ref 8–27)
CO2: 21 mmol/L (ref 20–29)
Calcium: 10.6 mg/dL — ABNORMAL HIGH (ref 8.7–10.3)
Chloride: 98 mmol/L (ref 96–106)
Creatinine, Ser: 0.82 mg/dL (ref 0.57–1.00)
Glucose: 88 mg/dL (ref 70–99)
Potassium: 4.8 mmol/L (ref 3.5–5.2)
Sodium: 139 mmol/L (ref 134–144)
eGFR: 75 mL/min/{1.73_m2} (ref >59)

## 2024-01-28 NOTE — Telephone Encounter (Signed)
Gina Fernandez -is patient continuing Prolia?

## 2024-02-08 ENCOUNTER — Encounter (HOSPITAL_COMMUNITY): Payer: Self-pay | Admitting: Cardiology

## 2024-02-08 ENCOUNTER — Encounter (HOSPITAL_COMMUNITY)
Admission: RE | Disposition: A | Discharge status: Home / Self Care | Payer: Self-pay | Source: Home / Self Care | Attending: Cardiology

## 2024-02-08 ENCOUNTER — Other Ambulatory Visit: Payer: Self-pay

## 2024-02-08 ENCOUNTER — Ambulatory Visit (HOSPITAL_COMMUNITY)
Admission: RE | Admit: 2024-02-08 | Discharge: 2024-02-08 | Disposition: A | Discharge status: Home / Self Care | Payer: Medicare PPO | Attending: Cardiology | Admitting: Cardiology

## 2024-02-08 DIAGNOSIS — Z7982 Long term (current) use of aspirin: Secondary | ICD-10-CM | POA: Insufficient documentation

## 2024-02-08 DIAGNOSIS — I25110 Atherosclerotic heart disease of native coronary artery with unstable angina pectoris: Secondary | ICD-10-CM | POA: Diagnosis not present

## 2024-02-08 DIAGNOSIS — Z79899 Other long term (current) drug therapy: Secondary | ICD-10-CM | POA: Diagnosis not present

## 2024-02-08 DIAGNOSIS — R0609 Other forms of dyspnea: Secondary | ICD-10-CM | POA: Insufficient documentation

## 2024-02-08 DIAGNOSIS — I10 Essential (primary) hypertension: Secondary | ICD-10-CM | POA: Diagnosis not present

## 2024-02-08 DIAGNOSIS — R0602 Shortness of breath: Secondary | ICD-10-CM | POA: Diagnosis present

## 2024-02-08 DIAGNOSIS — E7800 Pure hypercholesterolemia, unspecified: Secondary | ICD-10-CM | POA: Diagnosis not present

## 2024-02-08 DIAGNOSIS — I2510 Atherosclerotic heart disease of native coronary artery without angina pectoris: Secondary | ICD-10-CM

## 2024-02-08 DIAGNOSIS — Z8249 Family history of ischemic heart disease and other diseases of the circulatory system: Secondary | ICD-10-CM | POA: Diagnosis not present

## 2024-02-08 HISTORY — PX: LEFT HEART CATH AND CORONARY ANGIOGRAPHY: CATH118249

## 2024-02-08 SURGERY — LEFT HEART CATH AND CORONARY ANGIOGRAPHY
Anesthesia: LOCAL

## 2024-02-08 MED ORDER — HEPARIN SODIUM (PORCINE) 1000 UNIT/ML IJ SOLN
INTRAMUSCULAR | Status: DC | PRN
  Administered 2024-02-08: 3000 [IU] via INTRAVENOUS

## 2024-02-08 MED ORDER — MIDAZOLAM HCL 2 MG/2ML IJ SOLN
INTRAMUSCULAR | Status: AC
  Filled 2024-02-08: qty 2

## 2024-02-08 MED ORDER — ACETAMINOPHEN 325 MG PO TABS: 650.0000 mg | ORAL_TABLET | ORAL | Status: DC | PRN

## 2024-02-08 MED ORDER — MIDAZOLAM HCL 2 MG/2ML IJ SOLN
INTRAMUSCULAR | Status: DC | PRN
  Administered 2024-02-08: 1 mg via INTRAVENOUS
  Administered 2024-02-08: 2 mg via INTRAVENOUS

## 2024-02-08 MED ORDER — SODIUM CHLORIDE 0.9 % WEIGHT BASED INFUSION: 3.0000 mL/kg/h | INTRAVENOUS | Status: AC

## 2024-02-08 MED ORDER — VERAPAMIL HCL 2.5 MG/ML IV SOLN
INTRAVENOUS | Status: DC | PRN
  Administered 2024-02-08: 10 mL via INTRA_ARTERIAL

## 2024-02-08 MED ORDER — FENTANYL CITRATE (PF) 100 MCG/2ML IJ SOLN
INTRAMUSCULAR | Status: DC | PRN
  Administered 2024-02-08 (×2): 25 ug via INTRAVENOUS

## 2024-02-08 MED ORDER — ONDANSETRON HCL 4 MG/2ML IJ SOLN: 4.0000 mg | Freq: Four times a day (QID) | INTRAMUSCULAR | Status: DC | PRN

## 2024-02-08 MED ORDER — ASPIRIN 81 MG PO CHEW: 81.0000 mg | CHEWABLE_TABLET | ORAL | Status: AC

## 2024-02-08 MED ORDER — VERAPAMIL HCL 2.5 MG/ML IV SOLN
INTRAVENOUS | Status: AC
  Filled 2024-02-08: qty 2

## 2024-02-08 MED ORDER — SODIUM CHLORIDE 0.9 % IV SOLN: 250.0000 mL | INTRAVENOUS | Status: DC | PRN

## 2024-02-08 MED ORDER — LIDOCAINE HCL (PF) 1 % IJ SOLN
INTRAMUSCULAR | Status: DC | PRN
  Administered 2024-02-08: 2 mL

## 2024-02-08 MED ORDER — HEPARIN SODIUM (PORCINE) 1000 UNIT/ML IJ SOLN
INTRAMUSCULAR | Status: AC
  Filled 2024-02-08: qty 10

## 2024-02-08 MED ORDER — FENTANYL CITRATE (PF) 100 MCG/2ML IJ SOLN
INTRAMUSCULAR | Status: AC
  Filled 2024-02-08: qty 2

## 2024-02-08 MED ORDER — IOHEXOL 350 MG/ML SOLN
INTRAVENOUS | Status: DC | PRN
  Administered 2024-02-08: 28 mL

## 2024-02-08 MED ORDER — SODIUM CHLORIDE 0.9% FLUSH: 3.0000 mL | INTRAVENOUS | Status: DC | PRN

## 2024-02-08 MED ORDER — HEPARIN (PORCINE) IN NACL 1000-0.9 UT/500ML-% IV SOLN
INTRAVENOUS | Status: DC | PRN
  Administered 2024-02-08 (×2): 500 mL

## 2024-02-08 MED ORDER — SODIUM CHLORIDE 0.9 % WEIGHT BASED INFUSION: 1.0000 mL/kg/h | INTRAVENOUS | Status: DC

## 2024-02-08 MED ORDER — LIDOCAINE HCL (PF) 1 % IJ SOLN
INTRAMUSCULAR | Status: AC
  Filled 2024-02-08: qty 30

## 2024-02-08 SURGICAL SUPPLY — 10 items
CATH INFINITI AMBI 5FR TG (CATHETERS) IMPLANT
DEVICE RAD COMP TR BAND LRG (VASCULAR PRODUCTS) IMPLANT
GLIDESHEATH SLEND A-KIT 6F 22G (SHEATH) IMPLANT
GUIDEWIRE ANGLED .035X150CM (WIRE) IMPLANT
GUIDEWIRE INQWIRE 1.5J.035X260 (WIRE) IMPLANT
INQWIRE 1.5J .035X260CM (WIRE) ×1
KIT SYRINGE INJ CVI SPIKEX1 (MISCELLANEOUS) IMPLANT
PACK CARDIAC CATHETERIZATION (CUSTOM PROCEDURE TRAY) ×2 IMPLANT
SET ATX-X65L (MISCELLANEOUS) IMPLANT
SHEATH PROBE COVER 6X72 (BAG) IMPLANT

## 2024-02-08 NOTE — Interval H&P Note (Signed)
 History and Physical Interval Note:  02/08/2024 7:45 AM  Gina Fernandez  has presented today for surgery, with the diagnosis of shortness of breath.  The various methods of treatment have been discussed with the patient and family. After consideration of risks, benefits and other options for treatment, the patient has consented to  Procedure(s): LEFT HEART CATH AND CORONARY ANGIOGRAPHY (N/A) and possible coronary angioplasty as a surgical intervention.  The patient's history has been reviewed, patient examined, no change in status, stable for surgery.  I have reviewed the patient's chart and labs.  Questions were answered to the patient's satisfaction.   Cath Lab Visit (complete for each Cath Lab visit)  Clinical Evaluation Leading to the Procedure:   ACS: Yes.    Non-ACS:    Anginal Classification: CCS IV  Anti-ischemic medical therapy: Maximal Therapy (2 or more classes of medications)  Non-Invasive Test Results: No non-invasive testing performed  Prior CABG: No previous CABG    Knox Perl

## 2024-02-09 MED FILL — Lidocaine HCl Local Preservative Free (PF) Inj 1%: INTRAMUSCULAR | Qty: 30 | Status: AC

## 2024-02-24 DIAGNOSIS — M816 Localized osteoporosis [Lequesne]: Secondary | ICD-10-CM | POA: Diagnosis not present

## 2024-02-24 DIAGNOSIS — M545 Low back pain, unspecified: Secondary | ICD-10-CM | POA: Diagnosis not present

## 2024-02-24 DIAGNOSIS — Z6822 Body mass index (BMI) 22.0-22.9, adult: Secondary | ICD-10-CM | POA: Diagnosis not present

## 2024-02-25 ENCOUNTER — Encounter: Payer: Self-pay | Admitting: Cardiology

## 2024-02-25 ENCOUNTER — Ambulatory Visit: Payer: Medicare PPO | Attending: Cardiology | Admitting: Cardiology

## 2024-02-25 VITALS — BP 126/72 | HR 81 | Resp 16 | Ht 62.0 in | Wt 125.2 lb

## 2024-02-25 DIAGNOSIS — I25118 Atherosclerotic heart disease of native coronary artery with other forms of angina pectoris: Secondary | ICD-10-CM

## 2024-02-25 DIAGNOSIS — I10 Essential (primary) hypertension: Secondary | ICD-10-CM

## 2024-02-25 DIAGNOSIS — E7800 Pure hypercholesterolemia, unspecified: Secondary | ICD-10-CM | POA: Diagnosis not present

## 2024-02-25 MED ORDER — EZETIMIBE 10 MG PO TABS: 10.0000 mg | ORAL_TABLET | Freq: Every evening | ORAL | 3 refills | Status: DC

## 2024-02-25 MED ORDER — AMLODIPINE BESYLATE 5 MG PO TABS: 5.0000 mg | ORAL_TABLET | Freq: Every day | ORAL | 3 refills | Status: AC

## 2024-02-25 NOTE — Progress Notes (Signed)
 Cardiology Office Note:  .   Date:  02/25/2024  ID:  Gina Fernandez, DOB 01/20/1951, MRN [id number] PCP: Gweneth Dimitri, MD  [facility] Providers Cardiologist:  Yates Decamp, MD   History of Present Illness: Gina Fernandez is a 73 y.o.  Caucasian female patient was referred to me for evaluation of chest pain and dyspnea on exertion that started about 2 months ago.  Past medical history significant for hypertension, hypercholesterolemia, family history of premature heart disease, father having had MI at age 9, brother had CABG at age 12.  In view of progressive symptoms suggestive of angina pectoris, intermediate coronary syndrome like presentation, I recommended cardiac catheterization which he underwent on 02/08/2024 revealing very mild disease except apical LAD which is diffusely diseased with tandem 70 and 80% stenosis and recommended medical therapy.  She now presents for follow-up.  She has had occasional episodes of angina but has not used sublingual nitroglycerin.  Discussed the use of AI scribe software for clinical note transcription with the patient, who gave verbal consent to proceed.  History of Present Illness   Ms. Gina Fernandez, a patient with a history of hypertension and hyperlipidemia, presents for follow-up after a recent cardiac catheterization. She reports occasional chest pain, which she describes as discomfort, but notes that it has only been severe once. She has not used her prescribed nitroglycerin due to uncertainty about its effects and fear of exacerbating her headaches, which she attributes to her high blood pressure.  She has been taking simvastatin for her high cholesterol and has experienced muscle cramps in her feet, but is unsure if this is a side effect of the medication. She also developed a rash after catching a cold, but does not believe it is related to the medication.  In addition to her cardiovascular issues, she reports a recent fall caused by her  cat, resulting in back pain.      Labs   Lab Results  Component Value Date   CHOL 238 (H) 11/13/2023   HDL 94 11/13/2023   LDLCALC 121 (H) 11/13/2023   TRIG 115 11/13/2023   CHOLHDL 2.5 11/13/2023   Lab Results  Component Value Date   NA 139 01/27/2024   K 4.8 01/27/2024   CO2 21 01/27/2024   GLUCOSE 88 01/27/2024   BUN 24 01/27/2024   CREATININE 0.82 01/27/2024   CALCIUM 10.6 (H) 01/27/2024   EGFR 75 01/27/2024   GFRNONAA >60 10/17/2019      Latest Ref Rng & Units 01/27/2024    1:48 PM 11/13/2023    9:39 AM 05/01/2022    9:37 AM  BMP  Glucose 70 - 99 mg/dL 88     BUN 8 - 27 mg/dL 24     Creatinine 4.09 - 1.00 mg/dL 8.11     BUN/Creat Ratio 12 - 28 29     Sodium 134 - 144 mmol/L 139     Potassium 3.5 - 5.2 mmol/L 4.8     Chloride 96 - 106 mmol/L 98     CO2 20 - 29 mmol/L 21     Calcium 8.7 - 10.3 mg/dL 91.4  78.2  9.3       Latest Ref Rng & Units 01/27/2024    1:48 PM 02/06/2022    3:31 PM 10/17/2019    3:48 PM  CBC  WBC 3.4 - 10.8 x10E3/uL 5.4  5.4  5.9   Hemoglobin 11.1 - 15.9 g/dL 95.6  21.3  14.5  Hematocrit 34.0 - 46.6 % 42.8  41.9  45.7   Platelets 150 - 450 x10E3/uL 342  198  288    No results found for: "HGBA1C"  Lab Results  Component Value Date   TSH 0.64 04/15/2017   Review of Systems  Cardiovascular:  Positive for chest pain. Negative for dyspnea on exertion and leg swelling.   Physical Exam:   VS:  BP 126/72 (BP Location: Left Arm, Patient Position: Sitting, Cuff Size: Normal)   Pulse 81   Resp 16   Ht 5\' 2"  (1.575 m)   Wt 125 lb 3.2 oz (56.8 kg)   LMP 12/29/2004   SpO2 98%   BMI 22.90 kg/m    Wt Readings from Last 3 Encounters:  02/25/24 125 lb 3.2 oz (56.8 kg)  02/08/24 124 lb (56.2 kg)  01/27/24 124 lb (56.2 kg)     Physical Exam Neck:     Vascular: No carotid bruit or JVD.  Cardiovascular:     Rate and Rhythm: Normal rate and regular rhythm.     Pulses: Intact distal pulses.     Heart sounds: Normal heart sounds. No murmur  heard.    No gallop.  Pulmonary:     Effort: Pulmonary effort is normal.     Breath sounds: Normal breath sounds.  Abdominal:     General: Bowel sounds are normal.     Palpations: Abdomen is soft.  Musculoskeletal:     Right lower leg: No edema.     Left lower leg: No edema.    Studies Reviewed: Marland Kitchen    Left Heart Catheterization 02/08/24:    Impression: Probably significant disease is noted in the apical LAD with tandem 70 to 80% stenosis however apical LAD is diffusely diseased and has a hinge point with the stenotic segment, can be treated medically for now.  Small vessel.  Aggressive risk modification with statins and antianginal therapy is indicated at this time.  EKG:         Medications and allergies    Allergies  Allergen Reactions   Penicillins Swelling    Patient states her mother told her that PCN causes her face to swell but states that she tolerates amoxicillin well.     Statins     Body aches, tolerates simvastatin      Current Outpatient Medications:    acetaminophen (TYLENOL) 650 MG CR tablet, Take 1,300 mg by mouth daily., Disp: , Rfl:    aspirin (ASPIRIN CHILDRENS) 81 MG chewable tablet, Chew 1 tablet (81 mg total) by mouth daily., Disp: , Rfl:    carvedilol (COREG) 6.25 MG tablet, Take 6.25 mg by mouth 2 (two) times daily with a meal., Disp: , Rfl:    cloNIDine (CATAPRES) 0.1 MG tablet, Take 0.1 mg by mouth daily as needed (DBP over 100)., Disp: , Rfl:    ezetimibe (ZETIA) 10 MG tablet, Take 1 tablet (10 mg total) by mouth every evening., Disp: 90 tablet, Rfl: 3   L-Methylfolate 15 MG TABS, Take 15 mg by mouth daily., Disp: , Rfl:    nitroGLYCERIN (NITROSTAT) 0.4 MG SL tablet, Place 1 tablet (0.4 mg total) under the tongue every 5 (five) minutes as needed for chest pain., Disp: 25 tablet, Rfl: 1   simvastatin (ZOCOR) 40 MG tablet, Take 1 tablet (40 mg total) by mouth at bedtime., Disp: 30 tablet, Rfl: 2   telmisartan-hydrochlorothiazide (MICARDIS HCT)  80-12.5 MG tablet, Take 1 tablet by mouth daily., Disp: , Rfl:  triamcinolone ointment (KENALOG) 0.1 %, Apply topically 2 (two) times daily., Disp: , Rfl:    triazolam (HALCION) 0.25 MG tablet, Take 0.25-0.5 mg by mouth at bedtime., Disp: , Rfl:    venlafaxine XR (EFFEXOR-XR) 150 MG 24 hr capsule, Take 300 mg by mouth daily., Disp: , Rfl:    amLODipine (NORVASC) 5 MG tablet, Take 1 tablet (5 mg total) by mouth daily., Disp: 90 tablet, Rfl: 3   ASSESSMENT AND PLAN: .      ICD-10-CM   1. Coronary artery disease of native artery of native heart with stable angina pectoris (HCC)  I25.118 ezetimibe (ZETIA) 10 MG tablet    amLODipine (NORVASC) 5 MG tablet    2. Pure hypercholesterolemia  E78.00 ezetimibe (ZETIA) 10 MG tablet    3. Primary hypertension  I10 amLODipine (NORVASC) 5 MG tablet      Assessment and Plan    Chest Pain Intermittent chest pain with one severe episode was reported. Nitroglycerin was not used due to uncertainty about its effects. Educated on nitroglycerin use, explaining that the worst side effect is a headache, likely due to hypertension. Encouraged use of nitroglycerin as needed.  Coronary Artery Disease Multiple blockages (30%, 30%, 40%) and one significant blockage (80%) at the apical LAD. The 80% blockage is managed medically due to its location. Emphasized medical therapy to prevent minor blockages from becoming major. Explained that stents or bypass surgery are not needed and medical therapy is effective. Continue simvastatin, start ezetimibe 10 mg nightly, and check cholesterol levels in 6-8 weeks with a target LDL of < 70. Encouraged her not to be afraid to use nitroglycerin.  Hyperlipidemia LDL was 134, with a target of < 70. Currently on simvastatin, and ezetimibe will be added to further reduce cholesterol levels. Discussed ezetimibe's mechanism in reducing cholesterol absorption in the stomach. Explained that simvastatin inhibits hepatic cholesterol  synthesis, while ezetimibe inhibits intestinal cholesterol absorption. Agreed to start ezetimibe to achieve the target LDL. Continue simvastatin, start ezetimibe 10 mg nightly, and check cholesterol levels in 6-8 weeks with a target LDL of < 70.  Patient prefers to go ahead and start Zetia and does not want to wait to follow-up on lipids, I think it is a great idea to get and drive her LDL down as low as possible, preferably <70.  Hypertension Blood pressure is well-controlled on current medications (carvedilol, telmisartan, HCTZ). Clonidine is used as needed for hypertensive spikes. Confirmed carvedilol 6.25 mg is taken twice daily. Continue carvedilol 6.25 mg twice daily, telmisartan, and HCTZ. Use clonidine as needed for hypertensive spikes. Send prescription for amlodipine 5 mg daily for 90 days.  Follow-up Follow up in six months. Check cholesterol levels in 6-8 weeks. Send blood work results to cardiologist in April.         Signed,  Yates Decamp, MD, [facility] 02/25/2024, 5:20 PM [facility] [address] [place] [address] [place], [place] [address] Phone: [phone]. Fax:  [phone]

## 2024-02-25 NOTE — Patient Instructions (Signed)
 Medication Instructions:  Your physician has recommended you make the following change in your medication: Start Zetia 10 mg by mouth daily   *If you need a refill on your cardiac medications before your next appointment, please call your pharmacy*   Lab Work: none If you have labs (blood work) drawn today and your tests are completely normal, you will receive your results only by: MyChart Message (if you have MyChart) OR A paper copy in the mail If you have any lab test that is abnormal or we need to change your treatment, we will call you to review the results.   Testing/Procedures: none   Follow-Up: At [facility], you and your health needs are our priority.  As part of our continuing mission to provide you with exceptional heart care, we have created designated Provider Care Teams.  These Care Teams include your primary Cardiologist (physician) and Advanced Practice Providers (APPs -  Physician Assistants and Nurse Practitioners) who all work together to provide you with the care you need, when you need it.  We recommend signing up for the patient portal called "MyChart".  Sign up information is provided on this After Visit Summary.  MyChart is used to connect with patients for Virtual Visits (Telemedicine).  Patients are able to view lab/test results, encounter notes, upcoming appointments, etc.  Non-urgent messages can be sent to your provider as well.   To learn more about what you can do with MyChart, go to [email or web address].    Your next appointment:   6 month(s)  Provider:   Yates Decamp, MD     Other Instructions

## 2024-03-01 NOTE — Telephone Encounter (Signed)
 Call to patient. Advised will need to schedule AEX, patient states she had exam scheduled in 11/2023, but had to cancel. Message to appointments to schedule patient. Will proceed with osteoporosis management once patient seen by provider. Patient agreeable.   Encounter closed.

## 2024-03-03 DIAGNOSIS — M810 Age-related osteoporosis without current pathological fracture: Secondary | ICD-10-CM | POA: Diagnosis not present

## 2024-03-29 ENCOUNTER — Telehealth: Payer: Self-pay | Admitting: Cardiology

## 2024-03-29 NOTE — Telephone Encounter (Signed)
 Pt states Dr. Jacinto Halim started her on simvastatin back in late Feb, and ever since starting this medication, she has been experiencing a lot of side effects.   Pt states since taking this medication, she's been having increased joint and muscle pain, along with muscle cramps, and feeling extremely fatigued.  Pt reports she had none of these complaints prior to starting the medication.   Pt reports she takes the simvastatin at bedtime.   Pt states she would like for Dr. Jacinto Halim to further advise on another alternative regimen in place of the simvastatin, that she can better tolerate.   Advised her to hold the simvastatin for now, and I will forward this message to Dr. Jacinto Halim and his RN for further review and advisement.   Pt is aware that once Dr. Jacinto Halim advises, his nurse will call her accordingly thereafter with his suggestions.  Pt verbalized understanding and agrees with this plan.

## 2024-03-29 NOTE — Telephone Encounter (Signed)
 I spoke with patient and gave her message from Dr Jacinto Halim Patient reports Simvastatin was prescribed at 1/29 office visit.  Prior to this she had not been taking Simvastatin.   She had taken Zetia in the past but was not taking at time of 1/29 office visit. Zetia was started at 2/27 office visit. Patient will hold Simvastatin for 2 weeks and then call office with an update on her symptoms

## 2024-03-29 NOTE — Telephone Encounter (Signed)
 02/25/24 notes:  LDL was 134, with a target of < 70. Currently on simvastatin (40mg ) , and ezetimibe will be added to further reduce cholesterol levels.  She has been on simvastatin for a long time even prior to her prior office visit and I had added Zetia 10 mg daily.  I would recommend that she follow-up with her PCP and check vitamin D levels, hypovitaminosis D can also give joint pains as well.    Of all statins she has tolerated simvastatin in the past.  Hence would recommend continuing this or she could hold simvastatin for 2 weeks as a statin holiday and restart, this can also give relief of arthritis as well.

## 2024-03-29 NOTE — Telephone Encounter (Signed)
  Per MyChart scheduling message:  Pt c/o medication issue:  1. Name of Medication:   2. How are you currently taking this medication (dosage and times per day)?   3. Are you having a reaction (difficulty breathing--STAT)?   4. What is your medication issue?    Simvastatin and Amlodopine Aching joints, muscle cramps, extreme fatigue

## 2024-04-06 ENCOUNTER — Ambulatory Visit: Admitting: Obstetrics and Gynecology

## 2024-04-06 ENCOUNTER — Encounter: Payer: Self-pay | Admitting: Obstetrics and Gynecology

## 2024-04-06 VITALS — BP 122/78 | HR 85 | Temp 98.1°F | Ht 61.0 in | Wt 124.0 lb

## 2024-04-06 DIAGNOSIS — M810 Age-related osteoporosis without current pathological fracture: Secondary | ICD-10-CM

## 2024-04-06 DIAGNOSIS — Z01419 Encounter for gynecological examination (general) (routine) without abnormal findings: Secondary | ICD-10-CM

## 2024-04-06 DIAGNOSIS — Z1331 Encounter for screening for depression: Secondary | ICD-10-CM | POA: Diagnosis not present

## 2024-04-06 LAB — VITAMIN D 25 HYDROXY (VIT D DEFICIENCY, FRACTURES): Vit D, 25-Hydroxy: 26 ng/mL — ABNORMAL LOW (ref 30–100)

## 2024-04-06 MED ORDER — DENOSUMAB 60 MG/ML ~~LOC~~ SOSY
60.0000 mg | PREFILLED_SYRINGE | Freq: Once | SUBCUTANEOUS | 1 refills | Status: AC
Start: 2024-04-06 — End: 2024-04-06

## 2024-04-06 NOTE — Progress Notes (Signed)
 73 y.o. G1P0001 postmenopausal female with osteoporosis (was on prolia- 2018-May 2023), heart cath February 2025 here for annual exam. Divorced.  Retired high school principal.  2 grandkids.  Recent right heart catheterization. Note she is having difficulty navigating this phase of life.  Worked for a long time however is now retired.  Occasionally able to interim/part-time work.  She also sees her grandchildren and enjoys that however does not have many hobbies.  Postmenopausal bleeding: none Pelvic discharge or pain: none Breast mass, nipple discharge or skin changes : none Last PAP: No results found for: "DIAGPAP", "HPVHIGH", "ADEQPAP" Last mammogram: 10/30/2022 Last DXA: 01/21/23:T-score -2.4, improved from -2.7 Last colonoscopy: ~2022 per patient Sexually active: No Exercising: No Smoker: No  Flowsheet Row Office Visit from 04/06/2024 in [facility]  PHQ-2 Total Score 2     PHQ-9: 10 related to above issues  GYN HISTORY: No significant history  OB History  Gravida Para Term Preterm AB Living  1 1   0 1  SAB IAB Ectopic Multiple Live Births  0  0      # Outcome Date GA Lbr Len/2nd Weight Sex Type Anes PTL Lv  1 Para             Past Medical History:  Diagnosis Date   Allergy to alpha-gal    Anxiety    Calcification of both carotid arteries    Chest pain    Depression    Dysfunction of left eustachian tube    Dyspnea    Fracture of L1 vertebra (HCC)    Fracture, lumbar vertebra, compression (HCC)    Headache    has not had them since 30's   History of kyphoplasty    Hyperlipidemia    Hypertension    Impaired fasting glucose    Low vitamin B12 level    Osteoporosis 06/2019   T score -2.7 improved from prior DEXA on Prolia   Parotid gland pain    Tinnitus of left ear    TMJ (dislocation of temporomandibular joint)    Vitamin D deficiency     Past Surgical History:  Procedure Laterality Date   FOOT SURGERY  2010   LEFT  FOOT   KYPHOPLASTY N/A 05/28/2017   Procedure: LUMBAR 1 KYPHOPLASTY;  Surgeon: Estill Bamberg, MD;  Location: [facility] OR;  Service: Orthopedics;  Laterality: N/A;  LUMBAR 1 KYPHOPLASTY; REQUEST 1 HOUR AND FLIP ROOM   LEFT HEART CATH AND CORONARY ANGIOGRAPHY N/A 02/08/2024   Procedure: LEFT HEART CATH AND CORONARY ANGIOGRAPHY;  Surgeon: Yates Decamp, MD;  Location: [facility] INVASIVE CV LAB;  Service: Cardiovascular;  Laterality: N/A;   NOSE SURGERY     PAROTIDECTOMY Left 03/03/2022   Procedure: TOTAL PAROTIDECTOMY;  Surgeon: Newman Pies, MD;  Location: [facility];  Service: ENT;  Laterality: Left;   SHOULDER SURGERY Right 08/2020   TEE WITHOUT CARDIOVERSION N/A 09/25/2015   Procedure: TRANSESOPHAGEAL ECHOCARDIOGRAM (TEE);  Surgeon: Yates Decamp, MD;  Location: [facility] ENDOSCOPY;  Service: Cardiovascular;  Laterality: N/A;   TONSILLECTOMY      Current Outpatient Medications on File Prior to Visit  Medication Sig Dispense Refill   acetaminophen (TYLENOL) 650 MG CR tablet Take 1,300 mg by mouth daily.     amLODipine (NORVASC) 5 MG tablet Take 1 tablet (5 mg total) by mouth daily. 90 tablet 3   aspirin (ASPIRIN CHILDRENS) 81 MG chewable tablet Chew 1 tablet (81 mg total) by mouth daily.  calcitonin, salmon, (MIACALCIN/FORTICAL) 200 UNIT/ACT nasal spray SMARTSIG:Both Nares     carvedilol (COREG) 6.25 MG tablet Take 6.25 mg by mouth 2 (two) times daily with a meal.     cloNIDine (CATAPRES) 0.1 MG tablet Take 0.1 mg by mouth daily as needed (DBP over 100).     ezetimibe (ZETIA) 10 MG tablet Take 1 tablet (10 mg total) by mouth every evening. 90 tablet 3   HYDROcodone-acetaminophen (NORCO/VICODIN) 5-325 MG tablet Take 1 tablet by mouth every 6 (six) hours as needed.     L-Methylfolate 15 MG TABS Take 15 mg by mouth daily.     nitroGLYCERIN (NITROSTAT) 0.4 MG SL tablet Place 1 tablet (0.4 mg total) under the tongue every 5 (five) minutes as needed for chest pain. 25 tablet 1   telmisartan-hydrochlorothiazide  (MICARDIS HCT) 80-12.5 MG tablet Take 1 tablet by mouth daily.     triamcinolone ointment (KENALOG) 0.1 % Apply topically 2 (two) times daily.     triazolam (HALCION) 0.25 MG tablet Take 0.25-0.5 mg by mouth at bedtime.     venlafaxine XR (EFFEXOR-XR) 150 MG 24 hr capsule Take 300 mg by mouth daily.     simvastatin (ZOCOR) 40 MG tablet Take 1 tablet (40 mg total) by mouth at bedtime. (Patient not taking: Reported on 04/06/2024) 30 tablet 2   No current facility-administered medications on file prior to visit.    Social History   Socioeconomic History   Marital status: Divorced    Spouse name: Not on file   Number of children: 1   Years of education: Not on file   Highest education level: Not on file  Occupational History   Occupation: Retired  Tobacco Use   Smoking status: Never   Smokeless tobacco: Never  Vaping Use   Vaping status: Never Used  Substance and Sexual Activity   Alcohol use: Yes    Alcohol/week: 8.0 standard drinks of alcohol    Types: 3 Glasses of wine, 5 Standard drinks or equivalent per week    Comment: weekends mostly   Drug use: No   Sexual activity: Not Currently    Birth control/protection: Post-menopausal    Comment: 1st intercourse 73 yo-Fewer than 5 partners  Other Topics Concern   Not on file  Social History Narrative   1 diet coke a day    Social Drivers of Corporate investment banker Strain: Not on file  Food Insecurity: Not on file  Transportation Needs: Not on file  Physical Activity: Not on file  Stress: Not on file  Social Connections: Not on file  Intimate Partner Violence: Not on file    Family History  Problem Relation Age of Onset   Hypertension Mother    Aneurysm Mother    Hypertension Father    Heart disease Father    Breast cancer Maternal Aunt 55   Cancer Sister        Bladder   Heart disease Brother    Heart failure Sister     Allergies  Allergen Reactions   Penicillins Swelling    Patient states her mother told  her that PCN causes her face to swell but states that she tolerates amoxicillin well.     Statins     Body aches, tolerates simvastatin       PE Today's Vitals   04/06/24 1128  BP: 122/78  Pulse: 85  Temp: 98.1 F (36.7 C)  TempSrc: Oral  SpO2: 98%  Weight: 124 lb (56.2 kg)  Height:  5\' 1"  (1.549 m)   Body mass index is 23.43 kg/m.  Physical Exam Vitals reviewed. Exam conducted with a chaperone present.  Constitutional:      General: She is not in acute distress.    Appearance: Normal appearance.  HENT:     Head: Normocephalic and atraumatic.     Nose: Nose normal.  Eyes:     Extraocular Movements: Extraocular movements intact.     Conjunctiva/sclera: Conjunctivae normal.  Neck:     Thyroid: No thyroid mass, thyromegaly or thyroid tenderness.  Pulmonary:     Effort: Pulmonary effort is normal.  Chest:     Chest wall: No mass or tenderness.  Breasts:    Right: Normal. No swelling, mass, nipple discharge, skin change or tenderness.     Left: Normal. No swelling, mass, nipple discharge, skin change or tenderness.  Abdominal:     General: There is no distension.     Palpations: Abdomen is soft.     Tenderness: There is no abdominal tenderness.  Genitourinary:    General: Normal vulva.     Exam position: Lithotomy position.     Urethra: No prolapse.     Vagina: Normal. No vaginal discharge or bleeding.     Cervix: Normal. No lesion.     Uterus: Normal. Not enlarged and not tender.      Adnexa: Right adnexa normal and left adnexa normal.  Musculoskeletal:        General: Normal range of motion.     Cervical back: Normal range of motion.  Lymphadenopathy:     Upper Body:     Right upper body: No axillary adenopathy.     Left upper body: No axillary adenopathy.     Lower Body: No right inguinal adenopathy. No left inguinal adenopathy.  Skin:    General: Skin is warm and dry.  Neurological:     General: No focal deficit present.     Mental Status: She is alert.   Psychiatric:        Mood and Affect: Mood normal.        Behavior: Behavior normal.       Assessment and Plan:        Encounter for breast and pelvic examination Assessment & Plan: Cervical cancer screening performed according to ASCCP guidelines. Encouraged annual mammogram screening Colonoscopy UTD DXA UTD Labs and immunizations with her primary Encouraged safe sexual practices as indicated Encouraged healthy lifestyle practices with diet and exercise For patients over 70yo, I recommend 1200mg  calcium daily and 800IU of vitamin D daily.    Osteoporosis without current pathological fracture, unspecified osteoporosis type Assessment & Plan: Started on Prolia in 2018, however missed dosing last year Plan to restart Prolia 60mg  SQ injection (upper arm, thigh, or the abdomen) q6 months for up to 10y. Risk include hypocalcemia, musculoskeletal pain, rapid bone loss, osteonecrosis of the jaw, and increased fracture risk following discontinuation of medication.  Requires calcium within 10d of administration. Ensure dental checks, calcium and vitamin D supplementation. DXA q2y. Transition to bisphosphonate 6 month after last dose.    Orders: -     VITAMIN D 25 Hydroxy (Vit-D Deficiency, Fractures) -     Denosumab; Inject 60 mg into the skin once for 1 dose.  Dispense: 1 mL; Refill: 1    Rosalyn Gess, MD

## 2024-04-06 NOTE — Patient Instructions (Signed)

## 2024-04-06 NOTE — Assessment & Plan Note (Addendum)
 Started on Prolia in 2018, however missed dosing last year Plan to restart Prolia 60mg  SQ injection (upper arm, thigh, or the abdomen) q6 months for up to 10y. Risk include hypocalcemia, musculoskeletal pain, rapid bone loss, osteonecrosis of the jaw, and increased fracture risk following discontinuation of medication.  Requires calcium within 10d of administration. Ensure dental checks, calcium and vitamin D supplementation. DXA q2y. Transition to bisphosphonate 6 month after last dose.

## 2024-04-06 NOTE — Assessment & Plan Note (Signed)
 Cervical cancer screening performed according to ASCCP guidelines. Encouraged annual mammogram screening Colonoscopy UTD DXA UTD Labs and immunizations with her primary Encouraged safe sexual practices as indicated Encouraged healthy lifestyle practices with diet and exercise For patients over 75yo, I recommend 1200mg  calcium daily and 800IU of vitamin D daily.

## 2024-04-11 ENCOUNTER — Telehealth: Payer: Self-pay | Admitting: *Deleted

## 2024-04-11 DIAGNOSIS — M818 Other osteoporosis without current pathological fracture: Secondary | ICD-10-CM | POA: Diagnosis not present

## 2024-04-11 NOTE — Telephone Encounter (Signed)
 Annual exam: 04/06/24 GH  Calcium:     10.6       Date: 01/27/24  Upcoming dental procedures:   Hx of Kidney Disease:   Last Bone Density Scan: 01/21/23   Pt estimated Cost: Med is patient supplied.

## 2024-04-11 NOTE — Telephone Encounter (Signed)
 Lindajo Resides, CMA to Me      04/08/24 11:49 AM Result Note Pt notified and voiced understanding. While I was on the phone she said that her local pharmacy was reaching out to her about her prolia being ready for pick up. I advised the pt that that isnt usually the normal process and will notify our coordinator to confirm. However, pt stated that since pharmacy is so close to her, she is going to go by there and see what they are referring to and if able to pick up, will keep refrigerated. VITAMIN D 25 Hydroxy (Vit-D Deficiency, Fractures)   Call to patient. Prolia injection sent to [facility] on 04/06/24 and patient has already paid $64.00 and picked up. RN advised would return call to schedule.

## 2024-04-11 NOTE — Telephone Encounter (Signed)
-----   Message from Romaine Closs sent at 04/06/2024 12:08 PM EDT ----- DXA results: 2024, T-2.4, was on prolia since 2018, missed dosing last year Please order labs per protocol. Insurance: [employer]

## 2024-04-11 NOTE — Telephone Encounter (Signed)
 Patient's calcium level elevated on 01-07-24. Please advise if okay to begin prolia.

## 2024-04-11 NOTE — Telephone Encounter (Signed)
 Message left to return call to [place] at [phone].

## 2024-04-12 DIAGNOSIS — M8008XA Age-related osteoporosis with current pathological fracture, vertebra(e), initial encounter for fracture: Secondary | ICD-10-CM | POA: Diagnosis not present

## 2024-04-12 DIAGNOSIS — E782 Mixed hyperlipidemia: Secondary | ICD-10-CM | POA: Diagnosis not present

## 2024-04-18 DIAGNOSIS — Z6823 Body mass index (BMI) 23.0-23.9, adult: Secondary | ICD-10-CM | POA: Diagnosis not present

## 2024-04-18 DIAGNOSIS — E559 Vitamin D deficiency, unspecified: Secondary | ICD-10-CM | POA: Diagnosis not present

## 2024-04-18 DIAGNOSIS — M810 Age-related osteoporosis without current pathological fracture: Secondary | ICD-10-CM | POA: Diagnosis not present

## 2024-04-18 DIAGNOSIS — G4700 Insomnia, unspecified: Secondary | ICD-10-CM | POA: Diagnosis not present

## 2024-04-18 DIAGNOSIS — I25119 Atherosclerotic heart disease of native coronary artery with unspecified angina pectoris: Secondary | ICD-10-CM | POA: Diagnosis not present

## 2024-04-18 DIAGNOSIS — I10 Essential (primary) hypertension: Secondary | ICD-10-CM | POA: Diagnosis not present

## 2024-04-18 DIAGNOSIS — M8008XA Age-related osteoporosis with current pathological fracture, vertebra(e), initial encounter for fracture: Secondary | ICD-10-CM | POA: Diagnosis not present

## 2024-04-18 DIAGNOSIS — F330 Major depressive disorder, recurrent, mild: Secondary | ICD-10-CM | POA: Diagnosis not present

## 2024-04-18 DIAGNOSIS — E782 Mixed hyperlipidemia: Secondary | ICD-10-CM | POA: Diagnosis not present

## 2024-04-19 NOTE — Telephone Encounter (Signed)
I placed call to patient to follow up . Left message to call office.  

## 2024-04-28 DIAGNOSIS — F3341 Major depressive disorder, recurrent, in partial remission: Secondary | ICD-10-CM | POA: Diagnosis not present

## 2024-05-12 NOTE — Telephone Encounter (Signed)
 Left message to call office

## 2024-05-17 NOTE — Telephone Encounter (Signed)
 Left message to call office

## 2024-05-18 NOTE — Telephone Encounter (Signed)
 Left voicemail to return call to office

## 2024-05-18 NOTE — Telephone Encounter (Signed)
 Pt returning nurse call

## 2024-05-27 NOTE — Telephone Encounter (Signed)
 Left message to call office

## 2024-06-13 DIAGNOSIS — F3341 Major depressive disorder, recurrent, in partial remission: Secondary | ICD-10-CM | POA: Diagnosis not present

## 2024-07-13 DIAGNOSIS — Z131 Encounter for screening for diabetes mellitus: Secondary | ICD-10-CM | POA: Diagnosis not present

## 2024-07-13 DIAGNOSIS — E559 Vitamin D deficiency, unspecified: Secondary | ICD-10-CM | POA: Diagnosis not present

## 2024-07-13 DIAGNOSIS — E782 Mixed hyperlipidemia: Secondary | ICD-10-CM | POA: Diagnosis not present

## 2024-07-19 ENCOUNTER — Emergency Department (HOSPITAL_BASED_OUTPATIENT_CLINIC_OR_DEPARTMENT_OTHER)
Admission: EM | Admit: 2024-07-19 | Discharge: 2024-07-19 | Disposition: A | Discharge status: Home / Self Care | Source: Ambulatory Visit | Attending: Emergency Medicine | Admitting: Emergency Medicine

## 2024-07-19 ENCOUNTER — Encounter (HOSPITAL_BASED_OUTPATIENT_CLINIC_OR_DEPARTMENT_OTHER): Payer: Self-pay | Admitting: *Deleted

## 2024-07-19 ENCOUNTER — Emergency Department (HOSPITAL_BASED_OUTPATIENT_CLINIC_OR_DEPARTMENT_OTHER)

## 2024-07-19 ENCOUNTER — Other Ambulatory Visit: Payer: Self-pay

## 2024-07-19 DIAGNOSIS — R110 Nausea: Secondary | ICD-10-CM | POA: Insufficient documentation

## 2024-07-19 DIAGNOSIS — Z7982 Long term (current) use of aspirin: Secondary | ICD-10-CM | POA: Diagnosis not present

## 2024-07-19 DIAGNOSIS — R109 Unspecified abdominal pain: Secondary | ICD-10-CM | POA: Diagnosis not present

## 2024-07-19 DIAGNOSIS — K5730 Diverticulosis of large intestine without perforation or abscess without bleeding: Secondary | ICD-10-CM | POA: Diagnosis not present

## 2024-07-19 DIAGNOSIS — R1084 Generalized abdominal pain: Secondary | ICD-10-CM | POA: Diagnosis not present

## 2024-07-19 DIAGNOSIS — R638 Other symptoms and signs concerning food and fluid intake: Secondary | ICD-10-CM | POA: Insufficient documentation

## 2024-07-19 LAB — CBC WITH DIFFERENTIAL/PLATELET
Abs Immature Granulocytes: 0.01 K/uL (ref 0.00–0.07)
Basophils Absolute: 0 K/uL (ref 0.0–0.1)
Basophils Relative: 1 %
Eosinophils Absolute: 0 K/uL (ref 0.0–0.5)
Eosinophils Relative: 1 %
HCT: 40.5 % (ref 36.0–46.0)
Hemoglobin: 13.1 g/dL (ref 12.0–15.0)
Immature Granulocytes: 0 %
Lymphocytes Relative: 29 %
Lymphs Abs: 1.1 K/uL (ref 0.7–4.0)
MCH: 31 pg (ref 26.0–34.0)
MCHC: 32.3 g/dL (ref 30.0–36.0)
MCV: 95.7 fL (ref 80.0–100.0)
Monocytes Absolute: 0.3 K/uL (ref 0.1–1.0)
Monocytes Relative: 9 %
Neutro Abs: 2.4 K/uL (ref 1.7–7.7)
Neutrophils Relative %: 60 %
Platelets: 245 K/uL (ref 150–400)
RBC: 4.23 MIL/uL (ref 3.87–5.11)
RDW: 13.2 % (ref 11.5–15.5)
WBC: 3.9 K/uL — ABNORMAL LOW (ref 4.0–10.5)
nRBC: 0 % (ref 0.0–0.2)

## 2024-07-19 LAB — LIPASE, BLOOD: Lipase: 36 U/L (ref 11–51)

## 2024-07-19 LAB — COMPREHENSIVE METABOLIC PANEL WITH GFR
ALT: 23 U/L (ref 0–44)
AST: 26 U/L (ref 15–41)
Albumin: 4.9 g/dL (ref 3.5–5.0)
Alkaline Phosphatase: 71 U/L (ref 38–126)
Anion gap: 14 (ref 5–15)
BUN: 22 mg/dL (ref 8–23)
CO2: 24 mmol/L (ref 22–32)
Calcium: 9.6 mg/dL (ref 8.9–10.3)
Chloride: 102 mmol/L (ref 98–111)
Creatinine, Ser: 0.74 mg/dL (ref 0.44–1.00)
GFR, Estimated: >60 mL/min (ref >60)
Glucose, Bld: 91 mg/dL (ref 70–99)
Potassium: 4.3 mmol/L (ref 3.5–5.1)
Sodium: 139 mmol/L (ref 135–145)
Total Bilirubin: 0.3 mg/dL (ref 0.0–1.2)
Total Protein: 7.6 g/dL (ref 6.5–8.1)

## 2024-07-19 LAB — MAGNESIUM: Magnesium: 2.1 mg/dL (ref 1.7–2.4)

## 2024-07-19 MED ORDER — ONDANSETRON HCL 4 MG/2ML IJ SOLN
4.0000 mg | Freq: Once | INTRAMUSCULAR | Status: AC
  Administered 2024-07-19: 4 mg via INTRAVENOUS
  Filled 2024-07-19: qty 2

## 2024-07-19 MED ORDER — ONDANSETRON 4 MG PO TBDP
ORAL_TABLET | ORAL | 0 refills | Status: DC
Start: 2024-07-19 — End: 2024-08-23

## 2024-07-19 MED ORDER — MORPHINE SULFATE (PF) 4 MG/ML IV SOLN
4.0000 mg | Freq: Once | INTRAVENOUS | Status: AC
  Administered 2024-07-19: 4 mg via INTRAVENOUS
  Filled 2024-07-19: qty 1

## 2024-07-19 MED ORDER — SODIUM CHLORIDE 0.9 % IV BOLUS
1000.0000 mL | Freq: Once | INTRAVENOUS | Status: AC
  Administered 2024-07-19: 1000 mL via INTRAVENOUS

## 2024-07-19 MED ORDER — IOHEXOL 300 MG/ML  SOLN
100.0000 mL | Freq: Once | INTRAMUSCULAR | Status: AC | PRN
  Administered 2024-07-19: 100 mL via INTRAVENOUS

## 2024-07-19 NOTE — Discharge Instructions (Signed)
Take 8 scoops of miralax in 32oz of whatever you would like to drink.(Gatorade comes in this size) You can also use a fleets enema which you can buy over the counter at the pharmacy.  Return for worsening abdominal pain, vomiting or fever. ? ?

## 2024-07-19 NOTE — ED Notes (Signed)
 PIV start attempted x2, unsuccessful.  2nd RN requested for PIV start.

## 2024-07-19 NOTE — ED Triage Notes (Signed)
 Here by POV from home for abd pain and gradually progressive constipation. Endorses bowel changes. Onset 1 month ago, worse in last 10 days. BMs have recently stopped. Last BM 10d ago. Alert, NAD, calm. Endorses nausea, decreased appetite, decreased food intake, light headed. Denies fever, diarrhea.

## 2024-07-19 NOTE — ED Provider Notes (Signed)
 [facility] Provider Note   CSN: [id number] Arrival date & time: 07/19/24  1103     Patient presents with: Abdominal Pain   Gina Fernandez is a 73 y.o. female.   73 yo F with a chief complaint of difficulty moving her bowels.  Has been going on for about a month but worsening over the past 10 days.  She gets some little pebbles out maybe with some mucus.  Denies vomiting.  Has had some nausea.  Decreased appetite.  Patient has been apprehensive to use laxatives.  She did a digital vaginal exam and thinks that she felt something abnormal that was round and hard.  She did not do a rectal exam.   Abdominal Pain      Prior to Admission medications   Medication Sig Start Date End Date Taking? Authorizing Provider  ondansetron  (ZOFRAN -ODT) 4 MG disintegrating tablet 4mg  ODT q4 hours prn nausea/vomit 07/19/24  Yes Esli Jernigan, DO  acetaminophen  (TYLENOL ) 650 MG CR tablet Take 1,300 mg by mouth daily.    [provider]  amLODipine  (NORVASC ) 5 MG tablet Take 1 tablet (5 mg total) by mouth daily. 02/25/24 05/25/24  Ladona Heinz, MD  aspirin  (ASPIRIN  CHILDRENS) 81 MG chewable tablet Chew 1 tablet (81 mg total) by mouth daily. 01/27/24   Ladona Heinz, MD  calcitonin, salmon, (MIACALCIN/FORTICAL) 200 UNIT/ACT nasal spray SMARTSIG:Both Nares 03/30/24   [provider]  carvedilol (COREG) 6.25 MG tablet Take 6.25 mg by mouth 2 (two) times daily with a meal. 05/05/16   [provider]  cloNIDine (CATAPRES) 0.1 MG tablet Take 0.1 mg by mouth daily as needed (DBP over 100).    [provider]  ezetimibe  (ZETIA ) 10 MG tablet Take 1 tablet (10 mg total) by mouth every evening. 02/25/24 05/25/24  Ladona Heinz, MD  HYDROcodone-acetaminophen  (NORCO/VICODIN) 5-325 MG tablet Take 1 tablet by mouth every 6 (six) hours as needed. 02/27/24   [provider]  L-Methylfolate 15 MG TABS Take 15 mg by mouth daily. 08/29/22   [provider]  nitroGLYCERIN  (NITROSTAT ) 0.4 MG SL tablet Place 1 tablet (0.4 mg total) under the tongue every 5 (five) minutes as needed for chest pain. 01/27/24 04/26/24  Ladona Heinz, MD  simvastatin  (ZOCOR ) 40 MG tablet Take 1 tablet (40 mg total) by mouth at bedtime. Patient not taking: Reported on 04/06/2024 01/27/24 04/26/24  Ladona Heinz, MD  telmisartan-hydrochlorothiazide (MICARDIS HCT) 80-12.5 MG tablet Take 1 tablet by mouth daily.    [provider]  triamcinolone ointment (KENALOG) 0.1 % Apply topically 2 (two) times daily.    [provider]  triazolam (HALCION) 0.25 MG tablet Take 0.25-0.5 mg by mouth at bedtime.    [provider]  venlafaxine XR (EFFEXOR-XR) 150 MG 24 hr capsule Take 300 mg by mouth daily.    [provider]    Allergies: Penicillins and Statins    Review of Systems  Gastrointestinal:  Positive for abdominal pain.    Updated Vital Signs BP 132/81 (BP Location: Left Arm)   Pulse 78   Temp 98 F (36.7 C) (Oral)   Resp 16   Wt 59 kg   LMP 12/29/2004   SpO2 96%   BMI 24.56 kg/m   Physical Exam Vitals and nursing note reviewed.  Constitutional:      General: She is not in acute distress.    Appearance: She is well-developed. She is not diaphoretic.  HENT:  Head: Normocephalic and atraumatic.  Eyes:     Pupils: Pupils are equal, round, and reactive to light.  Cardiovascular:     Rate and Rhythm: Normal rate and regular rhythm.     Heart sounds: No murmur heard.    No friction rub. No gallop.  Pulmonary:     Effort: Pulmonary effort is normal.     Breath sounds: No wheezing or rales.  Abdominal:     General: There is no distension.     Palpations: Abdomen is soft.     Tenderness: There is no abdominal tenderness.  Musculoskeletal:        General: No tenderness.     Cervical back: Normal range of motion and neck supple.  Skin:    General: Skin is warm and dry.  Neurological:     Mental Status: She is alert  and oriented to person, place, and time.  Psychiatric:        Behavior: Behavior normal.     (all labs ordered are listed, but only abnormal results are displayed) Labs Reviewed  CBC WITH DIFFERENTIAL/PLATELET - Abnormal; Notable for the following components:      Result Value   WBC 3.9 (*)    All other components within normal limits  COMPREHENSIVE METABOLIC PANEL WITH GFR  LIPASE, BLOOD  MAGNESIUM    EKG: None  Radiology: CT ABDOMEN PELVIS W CONTRAST Result Date: 07/19/2024 CLINICAL DATA:  Acute pain.  Abdominal pain. EXAM: CT ABDOMEN AND PELVIS WITH CONTRAST TECHNIQUE: Multidetector CT imaging of the abdomen and pelvis was performed using the standard protocol following bolus administration of intravenous contrast. RADIATION DOSE REDUCTION: This exam was performed according to the departmental dose-optimization program which includes automated exposure control, adjustment of the mA and/or kV according to patient size and/or use of iterative reconstruction technique. CONTRAST:  OMNIPAQUE  IOHEXOL  300 MG/ML  SOLN COMPARISON:  None Available. FINDINGS: Lower chest: Lung bases are clear. Hepatobiliary: No focal hepatic lesion. Normal gallbladder. No biliary duct dilatation. Common bile duct is normal. Pancreas: Pancreas is normal. No ductal dilatation. No pancreatic inflammation. Spleen: Normal spleen Adrenals/urinary tract: Adrenal glands and kidneys are normal. The ureters and bladder normal. Stomach/Bowel: Stomach, duodenum small-bowel normal. The cecum is in an atypical location in LEFT upper quadrant. The cecum is changed position from CT 1 year prior. Appendix normal. No evidence of bowel obstruction. Multiple diverticula the LEFT colon. Rectum normal. Vascular/Lymphatic: Abdominal aorta is normal caliber. No periportal or retroperitoneal adenopathy. No pelvic adenopathy. Reproductive: Uterus and adnexa unremarkable. Other: No free fluid. Musculoskeletal: No aggressive osseous lesion.  IMPRESSION: 1. No acute findings in the abdomen pelvis. 2. Normal appendix. 3. Atypical location of the cecum in the LEFT upper quadrant. The cecum is changed position from CT 1 year prior. 4. LEFT colon diverticulosis without diverticulitis. Electronically Signed   By: Jackquline Boxer M.D.   On: 07/19/2024 13:46     Procedures   Medications Ordered in the ED  sodium chloride  0.9 % bolus 1,000 mL (0 mLs Intravenous Stopped 07/19/24 1340)  morphine  (PF) 4 MG/ML injection 4 mg (4 mg Intravenous Given 07/19/24 1320)  ondansetron  (ZOFRAN ) injection 4 mg (4 mg Intravenous Given 07/19/24 1320)  iohexol  (OMNIPAQUE ) 300 MG/ML solution 100 mL (100 mLs Intravenous Contrast Given 07/19/24 1326)                                    Medical Decision  Making Amount and/or Complexity of Data Reviewed Labs: ordered. Radiology: ordered.  Risk Prescription drug management.   73 yo F with a chief complaints of difficulty moving her bowels.  Going on for the past month but worse over the past 10 days or so.  She has not tried anything for this.  She is worried that something is wrong.  She did a digital exam on her vagina and not her rectum and thought she felt something round and hard.  I did discuss possible workup options here.  I encouraged her to have a rectal exam performed in case she is impacted has not could fix perhaps all of her symptoms.  She is declining at this time.  I did also discuss doing a vaginal exam to assess for the abnormality that she may have found and she is also declining.  Will obtain CT imaging.  Blood work.  Reassess.  No acute anemia, no significant electrolyte abnormalities.  FTs and lipase unremarkable.  CT imaging of the abdomen pelvis without obvious acute intra-abdominal pathology.  Radiology read of unknown significance cecum located in the left upper quadrant.  Patient feeling better on repeat assessment.  I discussed results with her.  Trial of cleanout at home.  PCP  follow-up.  As she had declined rectal and vaginal exam did discuss following up with GYN GI.  2:13 PM:  I have discussed the diagnosis/risks/treatment options with the patient.  Evaluation and diagnostic testing in the emergency department does not suggest an emergent condition requiring admission or immediate intervention beyond what has been performed at this time.  They will follow up with PCP, GI, GYN. We also discussed returning to the ED immediately if new or worsening sx occur. We discussed the sx which are most concerning (e.g., sudden worsening pain, fever, inability to tolerate by mouth) that necessitate immediate return. Medications administered to the patient during their visit and any new prescriptions provided to the patient are listed below.  Medications given during this visit Medications  sodium chloride  0.9 % bolus 1,000 mL (0 mLs Intravenous Stopped 07/19/24 1340)  morphine  (PF) 4 MG/ML injection 4 mg (4 mg Intravenous Given 07/19/24 1320)  ondansetron  (ZOFRAN ) injection 4 mg (4 mg Intravenous Given 07/19/24 1320)  iohexol  (OMNIPAQUE ) 300 MG/ML solution 100 mL (100 mLs Intravenous Contrast Given 07/19/24 1326)     The patient appears reasonably screen and/or stabilized for discharge and I doubt any other medical condition or other [facility] requiring further screening, evaluation, or treatment in the ED at this time prior to discharge.        Final diagnoses:  Generalized abdominal pain    ED Discharge Orders          Ordered    ondansetron  (ZOFRAN -ODT) 4 MG disintegrating tablet        07/19/24 1412               Emil Share, DO 07/19/24 1413

## 2024-07-19 NOTE — ED Notes (Signed)
 D/c paperwork reviewed with pt, including prescriptions and follow up care.  All questions and/or concerns addressed at time of d/c.  No further needs expressed. . Pt verbalized understanding, Ambulatory without assistance to ED exit, NAD.

## 2024-08-08 DIAGNOSIS — F3341 Major depressive disorder, recurrent, in partial remission: Secondary | ICD-10-CM | POA: Diagnosis not present

## 2024-08-16 ENCOUNTER — Emergency Department (HOSPITAL_BASED_OUTPATIENT_CLINIC_OR_DEPARTMENT_OTHER)

## 2024-08-16 ENCOUNTER — Emergency Department (HOSPITAL_BASED_OUTPATIENT_CLINIC_OR_DEPARTMENT_OTHER): Admission: EM | Admit: 2024-08-16 | Discharge: 2024-08-16 | Disposition: A | Discharge status: Home / Self Care

## 2024-08-16 ENCOUNTER — Other Ambulatory Visit: Payer: Self-pay

## 2024-08-16 ENCOUNTER — Encounter (HOSPITAL_BASED_OUTPATIENT_CLINIC_OR_DEPARTMENT_OTHER): Payer: Self-pay

## 2024-08-16 DIAGNOSIS — S92301A Fracture of unspecified metatarsal bone(s), right foot, initial encounter for closed fracture: Secondary | ICD-10-CM | POA: Diagnosis not present

## 2024-08-16 DIAGNOSIS — M79602 Pain in left arm: Secondary | ICD-10-CM | POA: Diagnosis not present

## 2024-08-16 DIAGNOSIS — W010XXA Fall on same level from slipping, tripping and stumbling without subsequent striking against object, initial encounter: Secondary | ICD-10-CM | POA: Insufficient documentation

## 2024-08-16 DIAGNOSIS — M79632 Pain in left forearm: Secondary | ICD-10-CM | POA: Diagnosis not present

## 2024-08-16 DIAGNOSIS — Z7982 Long term (current) use of aspirin: Secondary | ICD-10-CM | POA: Insufficient documentation

## 2024-08-16 DIAGNOSIS — M79671 Pain in right foot: Secondary | ICD-10-CM | POA: Diagnosis present

## 2024-08-16 DIAGNOSIS — M19071 Primary osteoarthritis, right ankle and foot: Secondary | ICD-10-CM | POA: Diagnosis not present

## 2024-08-16 DIAGNOSIS — S92351A Displaced fracture of fifth metatarsal bone, right foot, initial encounter for closed fracture: Secondary | ICD-10-CM | POA: Diagnosis not present

## 2024-08-16 MED ORDER — KETOROLAC TROMETHAMINE 60 MG/2ML IM SOLN
15.0000 mg | Freq: Once | INTRAMUSCULAR | Status: AC
  Administered 2024-08-16: 15 mg via INTRAMUSCULAR
  Filled 2024-08-16: qty 2

## 2024-08-16 MED ORDER — OXYCODONE-ACETAMINOPHEN 5-325 MG PO TABS: 1.0000 | ORAL_TABLET | Freq: Four times a day (QID) | ORAL | 0 refills | Status: AC | PRN

## 2024-08-16 NOTE — Discharge Instructions (Addendum)
 Your x-ray did show a broken bone in your foot.  Please use the postop shoe and crutches and avoid bearing weight on this.  Please call and schedule a follow-up appointment with the orthopedic surgeon at the number provided.  Take the Percocet as needed for pain.  Do not drive or drink alcohol while taking this as it may make you drowsy.

## 2024-08-16 NOTE — ED Notes (Signed)
 Spoke with pt. Verbally reviewed AVS with pt. Pt states understanding. Pt did not to return to ED and pick AVS up

## 2024-08-16 NOTE — ED Notes (Signed)
 Pt. Seen by EDP and tells him she fell last night in her bathroom on her tile floor catching herself on the floor in a sitting position with her L forearm with a small abrasion.    Pt. Also has a very bruised R foot from the R second toe to the 5th toe and the outer side to the R foot.  Pt. Drove herself here to the ED.

## 2024-08-16 NOTE — ED Notes (Signed)
 Pt left prior to AVS bring given to her. Attempting to call pt to review instructions with her. No answer

## 2024-08-16 NOTE — ED Provider Notes (Signed)
 [facility] Provider Note   CSN: [id number] Arrival date & time: 08/16/24  1105     Patient presents with: Gina Fernandez is a 73 y.o. female.   73 year old female with no reported past medical history presenting to the emergency department today with pain in her left forearm as well as her right foot after she tripped and fell yesterday.  The patient states she was walking from her bathroom when she tripped and fell.  She did hit her right foot which he fell and states that she has noticed a lot of bruising and pain with walking this morning.  The patient also complaining pain in her mid forearm.  She did not hit her head or lose consciousness.  This was not a syncopal episode.  She came to the ER today for further evaluation primarily due to the bruising on the foot and pain with walking.   Fall       Prior to Admission medications   Medication Sig Start Date End Date Taking? Authorizing Provider  oxyCODONE -acetaminophen  (PERCOCET/ROXICET) 5-325 MG tablet Take 1 tablet by mouth every 6 (six) hours as needed for severe pain (pain score 7-10). 08/16/24  Yes Ula Prentice SAUNDERS, MD  acetaminophen  (TYLENOL ) 650 MG CR tablet Take 1,300 mg by mouth daily.    [provider]  amLODipine  (NORVASC ) 5 MG tablet Take 1 tablet (5 mg total) by mouth daily. 02/25/24 05/25/24  Ladona Heinz, MD  aspirin  (ASPIRIN  CHILDRENS) 81 MG chewable tablet Chew 1 tablet (81 mg total) by mouth daily. 01/27/24   Ladona Heinz, MD  calcitonin, salmon, (MIACALCIN/FORTICAL) 200 UNIT/ACT nasal spray SMARTSIG:Both Nares 03/30/24   [provider]  carvedilol (COREG) 6.25 MG tablet Take 6.25 mg by mouth 2 (two) times daily with a meal. 05/05/16   [provider]  cloNIDine (CATAPRES) 0.1 MG tablet Take 0.1 mg by mouth daily as needed (DBP over 100).    [provider]  ezetimibe  (ZETIA ) 10 MG tablet Take 1 tablet (10 mg total) by mouth every  evening. 02/25/24 05/25/24  Ladona Heinz, MD  L-Methylfolate 15 MG TABS Take 15 mg by mouth daily. 08/29/22   [provider]  nitroGLYCERIN  (NITROSTAT ) 0.4 MG SL tablet Place 1 tablet (0.4 mg total) under the tongue every 5 (five) minutes as needed for chest pain. 01/27/24 04/26/24  Ladona Heinz, MD  ondansetron  (ZOFRAN -ODT) 4 MG disintegrating tablet 4mg  ODT q4 hours prn nausea/vomit 07/19/24   Floyd, Dan, DO  simvastatin  (ZOCOR ) 40 MG tablet Take 1 tablet (40 mg total) by mouth at bedtime. Patient not taking: Reported on 04/06/2024 01/27/24 04/26/24  Ladona Heinz, MD  telmisartan-hydrochlorothiazide (MICARDIS HCT) 80-12.5 MG tablet Take 1 tablet by mouth daily.    [provider]  triamcinolone ointment (KENALOG) 0.1 % Apply topically 2 (two) times daily.    [provider]  triazolam (HALCION) 0.25 MG tablet Take 0.25-0.5 mg by mouth at bedtime.    [provider]  venlafaxine XR (EFFEXOR-XR) 150 MG 24 hr capsule Take 300 mg by mouth daily.    [provider]    Allergies: Penicillins and Statins    Review of Systems  Musculoskeletal:  Positive for arthralgias.  All other systems reviewed and are negative.   Updated Vital Signs BP 126/77 (BP Location: Right Arm)   Pulse (!) 101   Temp 98.1 F (36.7 C) (Oral)   Resp 16   Ht 5' 1 (1.549 m)  Wt 59 kg   LMP 12/29/2004   SpO2 94%   BMI 24.58 kg/m   Physical Exam Vitals and nursing note reviewed.   Gen: NAD Eyes: PERRL, EOMI HEENT: no oropharyngeal swelling Neck: trachea midline Resp: clear to auscultation bilaterally Card: RRR, no murmurs, rubs, or gallops Abd: nontender, nondistended Extremities: no calf tenderness, no edema Vascular: 2+ radial pulses bilaterally, 2+ DP pulses bilaterally MSK: The patient is tender over the distal third of the left ulna as well as over the right fifth metacarpal with bruising noted.  The remainder of the extremities are atraumatic.  Compartments are  soft. Skin: no rashes Psyc: acting appropriately   (all labs ordered are listed, but only abnormal results are displayed) Labs Reviewed - No data to display  EKG: None  Radiology: DG Foot Complete Right Result Date: 08/16/2024 CLINICAL DATA:  Right foot pain after fall last night. EXAM: RIGHT FOOT COMPLETE - 3+ VIEW COMPARISON:  None Available. FINDINGS: Mildly displaced oblique fracture is seen involving fifth metatarsal. Moderate degenerative changes seen involving the first metatarsophalangeal joint. IMPRESSION: Mildly displaced fifth metatarsal fracture. Electronically Signed   By: Lynwood Landy Raddle M.D.   On: 08/16/2024 12:08   DG Forearm Left Result Date: 08/16/2024 EXAM: 2 VIEW(S) XRAY OF THE LEFT FOREARM 08/16/2024 11:47:07 AM COMPARISON: None available. CLINICAL HISTORY: Fall last night. Left forearm pain and right foot pain, swelling, bruising. FINDINGS: BONES AND JOINTS: No acute fracture. No focal osseous lesion. No joint dislocation. SOFT TISSUES: The soft tissues are unremarkable. IMPRESSION: 1. No significant abnormality. Electronically signed by: Rockey Kilts MD 08/16/2024 12:07 PM EDT RP Workstation: [id number]     Procedures   Medications Ordered in the ED  ketorolac  (TORADOL ) injection 15 mg (has no administration in time range)                                    Medical Decision Making 73 year old female with no reported past medical history presents the emergency department today with pain in her right foot and left arm after a mechanical fall at home.  Will obtain x-rays to eval for underlying fracture.  I will give the patient Toradol  for pain.  She is neurovascularly intact here with soft compartments suspicion for compartment syndrome is low at this time.  I will reevaluate for ultimate disposition.  The patient's forearm x-ray interpreted by me shows no acute fracture or dislocation.  Patient's right foot x-ray interpreted by me does show 1/5 medic tarsal  fracture that is minimally displaced.  The patient is placed in a postoperative shoe and provided crutches.  She will be discharged with orthopedic follow-up with return precautions.  Amount and/or Complexity of Data Reviewed Radiology: ordered.  Risk Prescription drug management.        Final diagnoses:  Closed displaced fracture of metatarsal bone of right foot, initial encounter    ED Discharge Orders          Ordered    oxyCODONE -acetaminophen  (PERCOCET/ROXICET) 5-325 MG tablet  Every 6 hours PRN        08/16/24 1219               Ula Prentice SAUNDERS, MD 08/16/24 1220

## 2024-08-16 NOTE — ED Triage Notes (Addendum)
 Pt reports to the ED after falling yesterday. States that she fell walking to the restroom. State that she hit the wall with her left arm and her right foot is hurting.   Denies hitting head. Pt states she is on blood thinner. \

## 2024-08-17 DIAGNOSIS — S92311A Displaced fracture of first metatarsal bone, right foot, initial encounter for closed fracture: Secondary | ICD-10-CM | POA: Diagnosis not present

## 2024-08-17 DIAGNOSIS — L089 Local infection of the skin and subcutaneous tissue, unspecified: Secondary | ICD-10-CM | POA: Diagnosis not present

## 2024-08-23 ENCOUNTER — Ambulatory Visit: Attending: Cardiology | Admitting: Cardiology

## 2024-08-23 ENCOUNTER — Encounter: Payer: Self-pay | Admitting: Cardiology

## 2024-08-23 VITALS — BP 132/70 | HR 80 | Ht 62.0 in | Wt 124.4 lb

## 2024-08-23 DIAGNOSIS — I25118 Atherosclerotic heart disease of native coronary artery with other forms of angina pectoris: Secondary | ICD-10-CM

## 2024-08-23 DIAGNOSIS — E7800 Pure hypercholesterolemia, unspecified: Secondary | ICD-10-CM

## 2024-08-23 DIAGNOSIS — I10 Essential (primary) hypertension: Secondary | ICD-10-CM

## 2024-08-23 NOTE — Progress Notes (Signed)
 Cardiology Office Note:  .   Date:  08/23/2024  ID:  Gina Fernandez, DOB [birth year], MRN [id number] PCP: Aisha Harvey, MD  [facility] Providers Cardiologist:  Gordy Bergamo, MD   History of Present Illness: Gina Fernandez is a 73 y.o. Caucasian female patient was referred to me for evaluation of chest pain and dyspnea on exertion that started about 2 months ago. Past medical history significant for hypertension, hypercholesterolemia, family history of premature heart disease, father having had MI at age 66, brother had CABG at age 65. In view of progressive symptoms suggestive of angina pectoris, intermediate coronary syndrome like presentation, I recommended cardiac catheterization which he underwent on 02/08/2024 revealing very mild disease except apical LAD which is diffusely diseased with tandem 70 and 80% stenosis and recommended medical therapy. She now presents for 99-month follow-up.   Except for occasional episodes of chest pain for which she takes nitroglycerin , dyspnea has remained stable.  Cardiac Studies relevent.    Left Heart Catheterization 02/08/24:        Discussed the use of AI scribe software for clinical note transcription with the patient, who gave verbal consent to proceed.  History of Present Illness Gina Fernandez is a 73 year old female with coronary artery disease who presents with shortness of breath and chest pain.  She experiences shortness of breath and chest pain during physical activities such as climbing stairs. The shortness of breath is severe, described as panting, and is accompanied by mild chest pain. She has used nitroglycerin  a couple of times, with the last use approximately two weeks ago.  Two weeks ago, she was switched to rosuvastatin 40 mg after being off statins for two weeks due to symptoms. During the period off medication, her LDL cholesterol increased. She is unsure if the new medication is effective but wants to ensure it is  working.  Her current medications include rosuvastatin 40 mg, tamisartan, clonidine as needed, carvedilol, and a baby aspirin  regularly. She did not bring a list of her medications to the visit but mentions that her medication list was updated recently.  She is generally able to perform her desired activities unless she exceeds her normal limits, which results in chest discomfort and shortness of breath.   Labs   Recent Labs    11/13/23 0939 01/27/24 1348 07/19/24 1205  NA  --  139 139  K  --  4.8 4.3  CL  --  98 102  CO2  --  21 24  GLUCOSE  --  88 91  BUN  --  24 22  CREATININE  --  0.82 0.74  CALCIUM 10.2 10.6* 9.6  GFRNONAA  --   --  >60    Lab Results  Component Value Date   ALT 23 07/19/2024   AST 26 07/19/2024   ALKPHOS 71 07/19/2024   BILITOT 0.3 07/19/2024      Latest Ref Rng & Units 07/19/2024   12:05 PM 01/27/2024    1:48 PM 02/06/2022    3:31 PM  CBC  WBC 4.0 - 10.5 K/uL 3.9  5.4  5.4   Hemoglobin 12.0 - 15.0 g/dL 86.8  85.1  86.2   Hematocrit 36.0 - 46.0 % 40.5  42.8  41.9   Platelets 150 - 400 K/uL 245  342  198    No results found for: HGBA1C  Lab Results  Component Value Date   TSH 0.64 04/15/2017    Lab Results  Component Value Date   CHOL 238 (H) 11/13/2023   HDL 94 11/13/2023   LDLCALC 121 (H) 11/13/2023   TRIG 115 11/13/2023   CHOLHDL 2.5 11/13/2023   Care everywhere/Faxed External Labs:  Lipid profile 07/17/2024:  Total cholesterol 196, triglycerides 93, HDL 94, LDL 86.  Non-HDL cholesterol 103.  A1c 5.9%.  ROS  Review of Systems  Cardiovascular:  Positive for chest pain and dyspnea on exertion. Negative for leg swelling.   Physical Exam:   VS:  BP 132/70 (BP Location: Right Arm, Patient Position: Sitting, Cuff Size: Normal)   Pulse 80   Ht 5' 2 (1.575 m)   Wt 124 lb 6.4 oz (56.4 kg)   LMP 12/29/2004   SpO2 96%   BMI 22.75 kg/m    Wt Readings from Last 3 Encounters:  08/23/24 124 lb 6.4 oz (56.4 kg)  08/16/24 130 lb 1.1  oz (59 kg)  07/19/24 130 lb (59 kg)    BP Readings from Last 3 Encounters:  08/23/24 132/70  08/16/24 126/77  07/19/24 133/72   Physical Exam Neck:     Vascular: No carotid bruit or JVD.  Cardiovascular:     Rate and Rhythm: Normal rate and regular rhythm.     Pulses: Intact distal pulses.     Heart sounds: Normal heart sounds. No murmur heard.    No gallop.  Pulmonary:     Effort: Pulmonary effort is normal.     Breath sounds: Normal breath sounds.  Abdominal:     General: Bowel sounds are normal.     Palpations: Abdomen is soft.  Musculoskeletal:     Right lower leg: No edema.     Left lower leg: No edema.    EKG:         ASSESSMENT AND PLAN: .      ICD-10-CM   1. Coronary artery disease of native artery of native heart with stable angina pectoris (HCC)  I25.118     2. Pure hypercholesterolemia  E78.00     3. Primary hypertension  I10      Assessment & Plan Coronary artery disease with scattered lesions Coronary artery disease with scattered lesions in the LAD and PDA, not significantly obstructive. Blockages do not warrant stenting as she does not impact longevity or significantly alter life quality. Medical therapy is appropriate to manage the condition, plaque morphology change, blood production with statin therapy discussed with the patient and reassured her. -Advised her to increase her physical activity as tolerated expecting occasional episodes of angina. - Continue medical therapy for coronary artery disease - Monitor symptoms and report if chest pain or shortness of breath worsens  Hyperlipidemia Hyperlipidemia managed with rosuvastatin 40 mg which was recently increased in dose by her PCP.  Previously was on Zocor  40 mg daily. LDL cholesterol is 86, above the target of less than 70. HDL cholesterol is excellent at 94, and non-HDL cholesterol is 103, close to the target of 100 or less. Increasing rosuvastatin dosage is expected to further lower LDL  levels. - Continue rosuvastatin 40 mg - Recheck cholesterol levels to achieve LDL less than 70 or non-HDL cholesterol <100.  Addition of Zetia  is also an option.  Hypertension Hypertension is well controlled with tamisartan and carvedilol. - Continue current antihypertensive medications: tamisartan and carvedilol.   Follow up: From cardiac standpoint she remains stable, risk factors are well-controlled, I will see her back on a as needed basis.  Signed,  Gordy Bergamo, MD, [facility] 08/23/2024, 2:20 PM  [facility] [address] [place], [place] [address] Phone: [phone]. Fax:  [phone]

## 2024-08-23 NOTE — Patient Instructions (Signed)

## 2024-08-26 DIAGNOSIS — M79671 Pain in right foot: Secondary | ICD-10-CM | POA: Diagnosis not present

## 2024-09-20 DIAGNOSIS — S92354D Nondisplaced fracture of fifth metatarsal bone, right foot, subsequent encounter for fracture with routine healing: Secondary | ICD-10-CM | POA: Diagnosis not present

## 2024-10-13 DIAGNOSIS — M8008XA Age-related osteoporosis with current pathological fracture, vertebra(e), initial encounter for fracture: Secondary | ICD-10-CM | POA: Diagnosis not present

## 2024-10-17 DIAGNOSIS — E559 Vitamin D deficiency, unspecified: Secondary | ICD-10-CM | POA: Diagnosis not present

## 2024-10-17 DIAGNOSIS — E782 Mixed hyperlipidemia: Secondary | ICD-10-CM | POA: Diagnosis not present

## 2024-10-20 DIAGNOSIS — Z0000 Encounter for general adult medical examination without abnormal findings: Secondary | ICD-10-CM | POA: Diagnosis not present

## 2024-10-20 DIAGNOSIS — Z23 Encounter for immunization: Secondary | ICD-10-CM | POA: Diagnosis not present

## 2024-10-20 DIAGNOSIS — I25119 Atherosclerotic heart disease of native coronary artery with unspecified angina pectoris: Secondary | ICD-10-CM | POA: Diagnosis not present

## 2024-10-20 DIAGNOSIS — R7301 Impaired fasting glucose: Secondary | ICD-10-CM | POA: Diagnosis not present

## 2024-10-20 DIAGNOSIS — L853 Xerosis cutis: Secondary | ICD-10-CM | POA: Diagnosis not present

## 2024-10-20 DIAGNOSIS — E782 Mixed hyperlipidemia: Secondary | ICD-10-CM | POA: Diagnosis not present

## 2024-10-20 DIAGNOSIS — I10 Essential (primary) hypertension: Secondary | ICD-10-CM | POA: Diagnosis not present

## 2024-10-20 DIAGNOSIS — E559 Vitamin D deficiency, unspecified: Secondary | ICD-10-CM | POA: Diagnosis not present

## 2024-10-20 DIAGNOSIS — M810 Age-related osteoporosis without current pathological fracture: Secondary | ICD-10-CM | POA: Diagnosis not present

## 2024-10-24 DIAGNOSIS — F3341 Major depressive disorder, recurrent, in partial remission: Secondary | ICD-10-CM | POA: Diagnosis not present

## 2024-11-03 DIAGNOSIS — K219 Gastro-esophageal reflux disease without esophagitis: Secondary | ICD-10-CM | POA: Diagnosis not present

## 2024-11-03 DIAGNOSIS — Z860101 Personal history of adenomatous and serrated colon polyps: Secondary | ICD-10-CM | POA: Diagnosis not present

## 2024-11-04 DIAGNOSIS — M542 Cervicalgia: Secondary | ICD-10-CM | POA: Diagnosis not present

## 2024-11-04 DIAGNOSIS — M5416 Radiculopathy, lumbar region: Secondary | ICD-10-CM | POA: Diagnosis not present

## 2024-11-04 DIAGNOSIS — M546 Pain in thoracic spine: Secondary | ICD-10-CM | POA: Diagnosis not present

## 2025-01-18 ENCOUNTER — Emergency Department (HOSPITAL_BASED_OUTPATIENT_CLINIC_OR_DEPARTMENT_OTHER)

## 2025-01-18 ENCOUNTER — Other Ambulatory Visit: Payer: Self-pay

## 2025-01-18 ENCOUNTER — Encounter (HOSPITAL_BASED_OUTPATIENT_CLINIC_OR_DEPARTMENT_OTHER): Payer: Self-pay | Admitting: Emergency Medicine

## 2025-01-18 ENCOUNTER — Emergency Department (HOSPITAL_BASED_OUTPATIENT_CLINIC_OR_DEPARTMENT_OTHER)
Admission: EM | Admit: 2025-01-18 | Discharge: 2025-01-18 | Disposition: A | Discharge status: Home / Self Care | Attending: Emergency Medicine | Admitting: Emergency Medicine

## 2025-01-18 DIAGNOSIS — S51851A Open bite of right forearm, initial encounter: Secondary | ICD-10-CM | POA: Insufficient documentation

## 2025-01-18 DIAGNOSIS — I10 Essential (primary) hypertension: Secondary | ICD-10-CM | POA: Diagnosis not present

## 2025-01-18 DIAGNOSIS — W5501XA Bitten by cat, initial encounter: Secondary | ICD-10-CM | POA: Diagnosis not present

## 2025-01-18 MED ORDER — AMOXICILLIN-POT CLAVULANATE 875-125 MG PO TABS: 1.0000 | ORAL_TABLET | Freq: Two times a day (BID) | ORAL | 0 refills | Status: AC

## 2025-01-18 NOTE — ED Notes (Signed)
 Pt texted owner of cat that bit her and she has cats record

## 2025-01-18 NOTE — ED Provider Notes (Signed)
 "  Emergency Department Provider Note   I have reviewed the triage vital signs and the nursing notes.   HISTORY  Chief Complaint Animal Bite   HPI Gina Fernandez is a 74 y.o. female past history reviewed below presents emergency department with pain in the right forearm.  She was bitten by a neighbor cat in the right forearm yesterday.  She has wounds to the volar aspect as well as the dorsal aspect of the mid forearm.  She feels this But it belongs to her neighbors who have not been home in the past 6 months.  She had some initial pain but swelling and redness have increased along with discomfort traveling to her hand and into her upper arm.  No numbness.  The rabies vaccine status is unknown.  The patient reached out to the owners who gave a date in 2024 but she is unsure if she can trust this.  She does not know which that this cat belongs to and has not contacted animal control.   Past Medical History:  Diagnosis Date   Allergy to alpha-gal    Anxiety    Calcification of both carotid arteries    Chest pain    Depression    Dysfunction of left eustachian tube    Dyspnea    Fracture of L1 vertebra (HCC)    Fracture, lumbar vertebra, compression (HCC)    Headache    has not had them since 30's   History of kyphoplasty    Hyperlipidemia    Hypertension    Impaired fasting glucose    Low vitamin B12 level    Osteoporosis 06/2019   T score -2.7 improved from prior DEXA on Prolia    Parotid gland pain    Tinnitus of left ear    TMJ (dislocation of temporomandibular joint)    Vitamin D  deficiency     Review of Systems  Constitutional: No fever/chills Cardiovascular: Denies chest pain. Respiratory: Denies shortness of breath. Gastrointestinal: No abdominal pain.  No nausea, no vomiting.  Genitourinary: Negative for dysuria. Musculoskeletal: Positive right arm pain.  Skin: Arm redness.  Neurological: Negative for  headaches.   ____________________________________________   PHYSICAL EXAM:  VITAL SIGNS: ED Triage Vitals  Encounter Vitals Group     BP 01/18/25 1106 (!) 137/93     Pulse Rate 01/18/25 1106 95     Resp 01/18/25 1106 15     Temp 01/18/25 1106 97.8 F (36.6 C)     Temp src --      SpO2 01/18/25 1106 98 %     Weight 01/18/25 1106 125 lb (56.7 kg)     Height 01/18/25 1106 5' 2 (1.575 m)   Constitutional: Alert and oriented. Well appearing and in no acute distress. Eyes: Conjunctivae are normal. Head: Atraumatic. Nose: No congestion/rhinnorhea. Mouth/Throat: Mucous membranes are moist. Neck: No stridor.   Cardiovascular: Normal rate, regular rhythm. Good peripheral circulation. Grossly normal heart sounds.   Respiratory: Normal respiratory effort.  No retractions. Lungs CTAB. Gastrointestinal: No distention.  Musculoskeletal: Compartments in the right forearm are soft.  The hand is neurovascularly intact.  She has erythema mainly along the dorsal aspect of the mid forearm with what appear to be superficial puncture wounds in that area.  Additional superficial puncture wounds along the flexor surface of the right forearm.  Preserved flexion/extension of the hand but reported pain with extension.  Neurologic:  Normal speech and language. No gross focal neurologic deficits are appreciated.  Skin:  Skin  is warm, dry. Erythema as above.   ____________________________________________  RADIOLOGY  DG Forearm Right Result Date: 01/18/2025 CLINICAL DATA:  Cat bite EXAM: RIGHT FOREARM - 2 VIEW COMPARISON:  None Available. FINDINGS: There is no evidence of fracture or other focal bone lesions. Soft tissues are unremarkable. IMPRESSION: Negative. Electronically Signed   By: Lynwood Landy Raddle M.D.   On: 01/18/2025 12:18    ____________________________________________   PROCEDURES  Procedure(s) performed:   Procedures  None  ____________________________________________   INITIAL  IMPRESSION / ASSESSMENT AND PLAN / ED COURSE  Pertinent labs & imaging results that were available during my care of the patient were reviewed by me and considered in my medical decision making (see chart for details).   This patient is Presenting for Evaluation of arm pain/bite, which does require a range of treatment options, and is a complaint that involves a moderate risk of morbidity and mortality.  The Differential Diagnoses include uncomplicated cat bite, flexor/extensor tenosynovitis, cellulitis, abscess, rabies exposure, etc.  Radiologic Tests Ordered, included forearm XR. I independently interpreted the images and agree with radiology interpretation.   Medical Decision Making: Summary:  The patient presents emergency department with right forearm pain after being bitten by a cat yesterday.  Owners gave update of rabies vaccine in 2024 but no documentation in the patient is unsure if she can fully trust this account.  Advised rabies vaccine and immunoglobulin in this setting and patient is considering this.  Plan for x-ray to rule out radiopaque foreign body but low suspicion for fracture.  Exam is not consistent with tenosynovitis at this time but will need to start antibiotics ASAP and have ortho follow up with any worsening symptoms.   Reevaluation with update and discussion with patient.  She was able to get a faxed copy of the cat's rabies vaccine history confirming the dose in 2024.  I would like to defer rabies vaccine at this time.  Encouraged her to follow-up with animal control regarding the incident but she is hesitant. Plan for abx and gave contact information for hand surgery on call should redness/pain worsen vs ED re-evaluation is symptoms are severe.    Patient's presentation is most consistent with acute, uncomplicated illness.   Disposition: discharge  ____________________________________________  FINAL CLINICAL IMPRESSION(S) / ED DIAGNOSES  Final diagnoses:  Cat  bite, initial encounter     NEW OUTPATIENT MEDICATIONS STARTED DURING THIS VISIT:  Discharge Medication List as of 01/18/2025 12:31 PM     START taking these medications   Details  amoxicillin -clavulanate (AUGMENTIN ) 875-125 MG tablet Take 1 tablet by mouth every 12 (twelve) hours., Starting Wed 01/18/2025, Normal        Note:  This document was prepared using Dragon voice recognition software and may include unintentional dictation errors.  Fonda Law, MD, [facility]    Perina Salvaggio, Fonda MATSU, MD 01/18/25 [phone]  "

## 2025-01-18 NOTE — Discharge Instructions (Signed)
 You were seen in the emergency department today after cat bite.  I am starting you on antibiotics.  Please continue to these medications as prescribed.  If your redness spreads rapidly or you develop fever or severe pain in the arm you should return to the emergency department.  I have listed the name of an orthopedist on-call today.  You should call to follow-up with them in the office in the next 5 to 7 days if your symptoms persist but are not severe.

## 2025-01-18 NOTE — ED Triage Notes (Signed)
 Pt reports cat bite to R forearm yesterday afternoon. Animal with unknown vaccine status.
